# Patient Record
Sex: Female | Born: 1990 | Race: White | Hispanic: No | Marital: Single | Smoking: Never smoker
Health system: Southern US, Community
[De-identification: ages and names within clinical notes are randomized; demographics above are authoritative.]

## PROBLEM LIST (undated history)

## (undated) DIAGNOSIS — T7840XA Allergy, unspecified, initial encounter: Secondary | ICD-10-CM

## (undated) DIAGNOSIS — F988 Other specified behavioral and emotional disorders with onset usually occurring in childhood and adolescence: Secondary | ICD-10-CM

## (undated) DIAGNOSIS — F32A Depression, unspecified: Secondary | ICD-10-CM

## (undated) DIAGNOSIS — F329 Major depressive disorder, single episode, unspecified: Secondary | ICD-10-CM

## (undated) DIAGNOSIS — A6000 Herpesviral infection of urogenital system, unspecified: Secondary | ICD-10-CM

## (undated) DIAGNOSIS — F419 Anxiety disorder, unspecified: Secondary | ICD-10-CM

## (undated) HISTORY — DX: Other specified behavioral and emotional disorders with onset usually occurring in childhood and adolescence: F98.8

## (undated) HISTORY — DX: Herpesviral infection of urogenital system, unspecified: A60.00

## (undated) HISTORY — DX: Anxiety disorder, unspecified: F41.9

## (undated) HISTORY — DX: Depression, unspecified: F32.A

## (undated) HISTORY — DX: Major depressive disorder, single episode, unspecified: F32.9

## (undated) HISTORY — PX: WISDOM TOOTH EXTRACTION: SHX21

## (undated) HISTORY — DX: Allergy, unspecified, initial encounter: T78.40XA

---
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
## 2012-04-16 IMAGING — CR DG CHEST 1V PORT
1 series · 1 of 1 positions shown · non-contrast
Comparison: none

REASON FOR EXAM: Chest Pain
COMMENTS:

PROCEDURE:     DXR - DXR PORTABLE CHEST SINGLE VIEW  - September 01, 2010 [DATE]
RESULT:     The lung fields are clear. The heart, mediastinal and osseous
structures are normal in appearance.

[view not recorded]
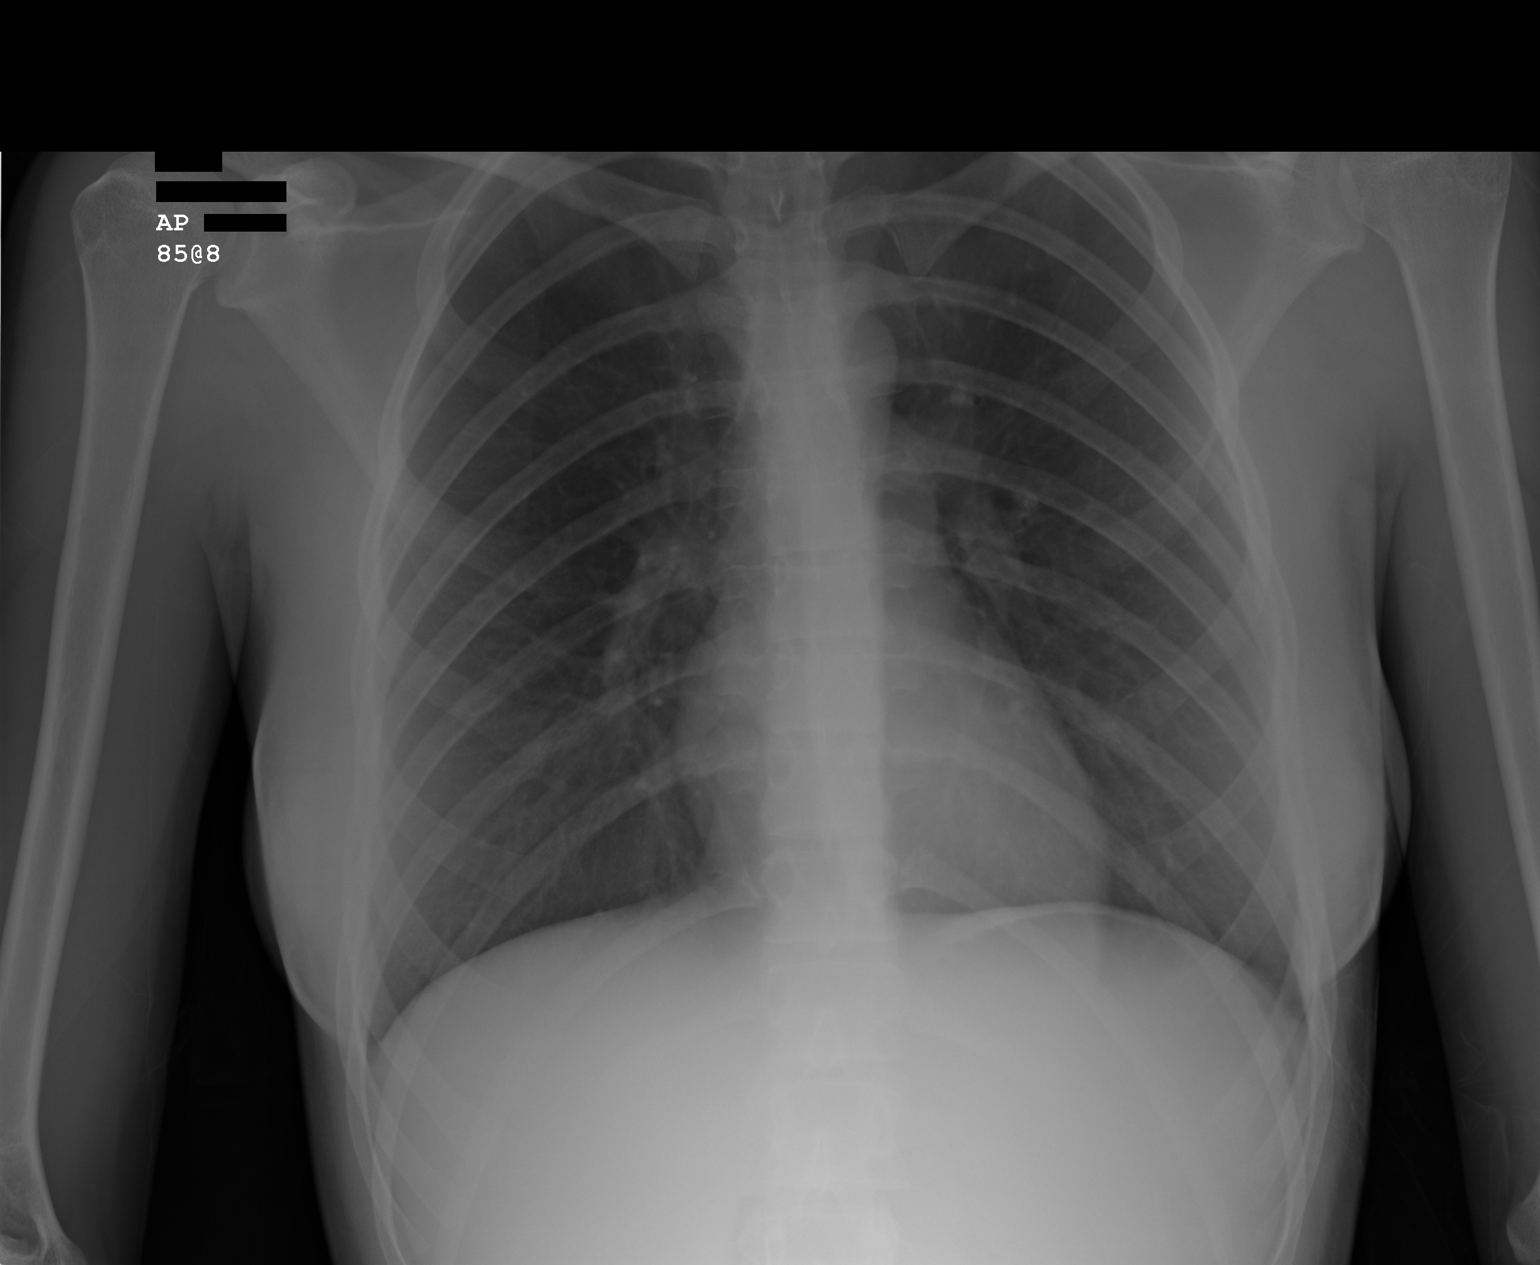

[1 of 1 positions shown; findings below may reference images not displayed]

IMPRESSION: No significant abnormalities are noted.

## 2013-08-05 ENCOUNTER — Emergency Department: Payer: Self-pay | Admitting: Emergency Medicine

## 2013-08-06 LAB — URINALYSIS, COMPLETE
Bilirubin,UR: NEGATIVE
Glucose,UR: NEGATIVE mg/dL (ref 0–75)
Ketone: NEGATIVE
Leukocyte Esterase: NEGATIVE
Nitrite: NEGATIVE
Ph: 6 (ref 4.5–8.0)
Protein: NEGATIVE
RBC,UR: 1 /HPF (ref 0–5)
Specific Gravity: 1.019 (ref 1.003–1.030)
Squamous Epithelial: 1
WBC UR: 1 /HPF (ref 0–5)

## 2013-08-06 LAB — COMPREHENSIVE METABOLIC PANEL
Albumin: 4.6 g/dL (ref 3.4–5.0)
Alkaline Phosphatase: 61 U/L (ref 50–136)
Anion Gap: 5 — ABNORMAL LOW (ref 7–16)
BUN: 13 mg/dL (ref 7–18)
Chloride: 103 mmol/L (ref 98–107)
Co2: 28 mmol/L (ref 21–32)
Creatinine: 0.71 mg/dL (ref 0.60–1.30)
Potassium: 3.9 mmol/L (ref 3.5–5.1)
SGOT(AST): 21 U/L (ref 15–37)
SGPT (ALT): 20 U/L (ref 12–78)
Sodium: 136 mmol/L (ref 136–145)
Total Protein: 8.1 g/dL (ref 6.4–8.2)

## 2013-08-06 LAB — CBC
HCT: 40.1 % (ref 35.0–47.0)
HGB: 13.5 g/dL (ref 12.0–16.0)
MCH: 29.9 pg (ref 26.0–34.0)
MCV: 89 fL (ref 80–100)
Platelet: 307 10*3/uL (ref 150–440)
RBC: 4.52 10*6/uL (ref 3.80–5.20)
WBC: 9.9 10*3/uL (ref 3.6–11.0)

## 2013-08-06 LAB — LIPASE, BLOOD: Lipase: 86 U/L (ref 73–393)

## 2014-07-21 ENCOUNTER — Encounter (HOSPITAL_COMMUNITY): Payer: Self-pay | Admitting: Emergency Medicine

## 2014-07-21 ENCOUNTER — Emergency Department (HOSPITAL_COMMUNITY): Payer: BC Managed Care – PPO

## 2014-07-21 ENCOUNTER — Emergency Department (HOSPITAL_COMMUNITY)
Admission: EM | Admit: 2014-07-21 | Discharge: 2014-07-21 | Disposition: A | Discharge status: Home / Self Care | Payer: BC Managed Care – PPO | Attending: Emergency Medicine | Admitting: Emergency Medicine

## 2014-07-21 DIAGNOSIS — R109 Unspecified abdominal pain: Secondary | ICD-10-CM | POA: Insufficient documentation

## 2014-07-21 DIAGNOSIS — Z3202 Encounter for pregnancy test, result negative: Secondary | ICD-10-CM | POA: Insufficient documentation

## 2014-07-21 DIAGNOSIS — Z880 Allergy status to penicillin: Secondary | ICD-10-CM | POA: Diagnosis not present

## 2014-07-21 DIAGNOSIS — R1110 Vomiting, unspecified: Secondary | ICD-10-CM | POA: Insufficient documentation

## 2014-07-21 DIAGNOSIS — Z791 Long term (current) use of non-steroidal anti-inflammatories (NSAID): Secondary | ICD-10-CM | POA: Insufficient documentation

## 2014-07-21 LAB — URINALYSIS, ROUTINE W REFLEX MICROSCOPIC
Bilirubin Urine: NEGATIVE
GLUCOSE, UA: NEGATIVE mg/dL
KETONES UR: 15 mg/dL — AB
Nitrite: NEGATIVE
PROTEIN: NEGATIVE mg/dL
Specific Gravity, Urine: 1.025 (ref 1.005–1.030)
Urobilinogen, UA: 0.2 mg/dL (ref 0.0–1.0)
pH: 7 (ref 5.0–8.0)

## 2014-07-21 LAB — COMPREHENSIVE METABOLIC PANEL
ALBUMIN: 4.5 g/dL (ref 3.5–5.2)
ALT: 10 U/L (ref 0–35)
AST: 17 U/L (ref 0–37)
Alkaline Phosphatase: 33 U/L — ABNORMAL LOW (ref 39–117)
Anion gap: 15 (ref 5–15)
BILIRUBIN TOTAL: 1.2 mg/dL (ref 0.3–1.2)
BUN: 16 mg/dL (ref 6–23)
CHLORIDE: 103 meq/L (ref 96–112)
CO2: 24 meq/L (ref 19–32)
Calcium: 9.7 mg/dL (ref 8.4–10.5)
Creatinine, Ser: 1.13 mg/dL — ABNORMAL HIGH (ref 0.50–1.10)
GFR calc Af Amer: 80 mL/min — ABNORMAL LOW (ref >90)
GFR, EST NON AFRICAN AMERICAN: 69 mL/min — AB (ref >90)
Glucose, Bld: 97 mg/dL (ref 70–99)
POTASSIUM: 4.2 meq/L (ref 3.7–5.3)
SODIUM: 142 meq/L (ref 137–147)
Total Protein: 7.9 g/dL (ref 6.0–8.3)

## 2014-07-21 LAB — CBC WITH DIFFERENTIAL/PLATELET
BASOS ABS: 0 10*3/uL (ref 0.0–0.1)
Basophils Relative: 0 % (ref 0–1)
EOS ABS: 0.1 10*3/uL (ref 0.0–0.7)
Eosinophils Relative: 1 % (ref 0–5)
HCT: 40.5 % (ref 36.0–46.0)
Hemoglobin: 13.5 g/dL (ref 12.0–15.0)
LYMPHS ABS: 1.8 10*3/uL (ref 0.7–4.0)
LYMPHS PCT: 14 % (ref 12–46)
MCH: 29.9 pg (ref 26.0–34.0)
MCHC: 33.3 g/dL (ref 30.0–36.0)
MCV: 89.8 fL (ref 78.0–100.0)
MONO ABS: 0.7 10*3/uL (ref 0.1–1.0)
Monocytes Relative: 5 % (ref 3–12)
Neutro Abs: 10.3 10*3/uL — ABNORMAL HIGH (ref 1.7–7.7)
Neutrophils Relative %: 80 % — ABNORMAL HIGH (ref 43–77)
Platelets: 328 10*3/uL (ref 150–400)
RBC: 4.51 MIL/uL (ref 3.87–5.11)
RDW: 12.8 % (ref 11.5–15.5)
WBC: 12.9 10*3/uL — AB (ref 4.0–10.5)

## 2014-07-21 LAB — URINE MICROSCOPIC-ADD ON

## 2014-07-21 LAB — PREGNANCY, URINE: Preg Test, Ur: NEGATIVE

## 2014-07-21 MED ORDER — RAMELTEON 8 MG PO TABS: 8.0000 mg | ORAL_TABLET | Freq: Every day | ORAL | Status: DC

## 2014-07-21 MED ORDER — PROMETHAZINE HCL 25 MG PO TABS: 25.0000 mg | ORAL_TABLET | Freq: Four times a day (QID) | ORAL | Status: DC | PRN

## 2014-07-21 MED ORDER — OXYCODONE-ACETAMINOPHEN 5-325 MG PO TABS
ORAL_TABLET | ORAL | Status: DC
Start: 2014-07-21 — End: 2014-07-21

## 2014-07-21 MED ORDER — HYDROCODONE-ACETAMINOPHEN 5-325 MG PO TABS: ORAL_TABLET | ORAL | Status: DC

## 2014-07-21 MED ORDER — OXYCODONE-ACETAMINOPHEN 5-325 MG PO TABS
1.0000 | ORAL_TABLET | Freq: Once | ORAL | Status: AC
  Administered 2014-07-21: 1 via ORAL
  Filled 2014-07-21: qty 1

## 2014-07-21 MED ORDER — ONDANSETRON 4 MG PO TBDP
8.0000 mg | ORAL_TABLET | Freq: Once | ORAL | Status: AC
  Administered 2014-07-21: 8 mg via ORAL
  Filled 2014-07-21: qty 2

## 2014-07-21 NOTE — Discharge Instructions (Signed)
Take vicodin for breakthrough pain, do not drink alcohol, drive, care for children or do other critical tasks while taking vicodin.  Push fluids: take small frequent sips of water or Gatorade, do not drink any soda, juice or caffeinated beverages.    Slowly resume solid diet as desired. Avoid food that are spicy, contain dairy and/or have high fat content.  Aviod NSAIDs (aspirin, motrin, ibuprofen, naproxen, Aleve et Karie Soda) for pain control because they will irritate your stomach.  Please follow with your primary care doctor in the next 2 days for a check-up. They must obtain records for further management.   Do not hesitate to return to the Emergency Department for any new, worsening or concerning symptoms.

## 2014-07-21 NOTE — ED Notes (Signed)
Pt reports she woke up this morning with left sided flank pain it progressively became worse. Denies blood in urine/dysuria. Denies abd pain. Pt tearful. Nad, skin warm and dry, resp e/u.

## 2014-07-21 NOTE — ED Provider Notes (Signed)
CSN: [id number]     Arrival date & time 07/21/14  1425 History   First MD Initiated Contact with Patient 07/21/14 1804     Chief Complaint  Patient presents with  . Flank Pain     (Consider location/radiation/quality/duration/timing/severity/associated sxs/prior Treatment) HPI  Joyce Wallace is a 23 y.o. female complaining of left flank pain onset this morning at about 11:30. Patient states that originally the pain was a 5/10 and it increased to 9/10. Patient states that she took naproxen with little relief. Patient had 2 episodes of nonbloody, nonbilious, non-coffee ground emesis. Patient denies history of kidney stones, fever, chills, dysuria, urinary frequency concentrated or foul-smelling urine, hematuria (patient states that she is about to start her menstrual cycle), abnormal vaginal discharge, trauma, abnormal exertion/excersize, heavy lifting. She states she had a mild lower abdominal cramping this morning which she attributed to premenstrual syndrome. States that this cramping is now resolved. Patient was given Percocet in triage, has been tolerating by mouth and reports resolution of nausea and vomiting.  History reviewed. No pertinent past medical history. History reviewed. No pertinent past surgical history. No family history on file. History  Substance Use Topics  . Smoking status: Never Smoker   . Smokeless tobacco: Not on file  . Alcohol Use: Yes     Comment: occassionally   OB History   Grav Para Term Preterm Abortions TAB SAB Ect Mult Living                 Review of Systems  10 systems reviewed and found to be negative, except as noted in the HPI.    Allergies  Amoxicillin  Home Medications   Prior to Admission medications   Medication Sig Start Date End Date Taking? Authorizing Provider  ibuprofen (ADVIL,MOTRIN) 200 MG tablet Take 800 mg by mouth every 6 (six) hours as needed for mild pain.   Yes Historical Provider, MD  HYDROcodone-acetaminophen  (NORCO/VICODIN) 5-325 MG per tablet Take 1-2 tablets by mouth every 6 hours as needed for pain. 07/21/14   Lander Eslick, PA-C  promethazine (PHENERGAN) 25 MG tablet Take 1 tablet (25 mg total) by mouth every 6 (six) hours as needed for nausea or vomiting. 07/21/14   [name], PA-C  ramelteon (ROZEREM) 8 MG tablet Take 1 tablet (8 mg total) by mouth at bedtime. 07/21/14   [name], PA-C   BP 115/76  Pulse 67  Temp(Src) 98.1 F (36.7 C) (Oral)  Resp 16  Ht  (1.6 m)  Wt 121 lb (54.885 kg)  BMI 21.44 kg/m2  SpO2 100%  LMP 06/24/2014 Physical Exam  Nursing note and vitals reviewed. Constitutional: She is oriented to person, place, and time. She appears well-developed and well-nourished. No distress.  HENT:  Head: Normocephalic and atraumatic.  Mouth/Throat: Oropharynx is clear and moist.  Eyes: Conjunctivae and EOM are normal.  Neck: Normal range of motion. Neck supple.  Cardiovascular: Normal rate, regular rhythm and intact distal pulses.   Pulmonary/Chest: Breath sounds normal. No stridor. No respiratory distress. She has no wheezes. She has no rales. She exhibits no tenderness.  Abdominal: Soft. Bowel sounds are normal. She exhibits no distension and no mass. There is no tenderness. There is no rebound and no guarding.  Genitourinary:  Vision diffusely tender to palpation along the left flank. No discrete CVA tenderness. No signs of trauma, lung sounds clear to auscultation with no crepitance  Musculoskeletal: Normal range of motion.       Back:  Neurological:  She is alert and oriented to person, place, and time.  Psychiatric: She has a normal mood and affect.    ED Course  Procedures (including critical care time) Labs Review Labs Reviewed  COMPREHENSIVE METABOLIC PANEL - Abnormal; Notable for the following:    Creatinine, Ser 1.13 (*)    Alkaline Phosphatase 33 (*)    GFR calc non Af Amer 69 (*)    GFR calc Af Amer 80 (*)    All other components within  normal limits  CBC WITH DIFFERENTIAL - Abnormal; Notable for the following:    WBC 12.9 (*)    Neutrophils Relative % 80 (*)    Neutro Abs 10.3 (*)    All other components within normal limits  URINALYSIS, ROUTINE W REFLEX MICROSCOPIC - Abnormal; Notable for the following:    APPearance CLOUDY (*)    Hgb urine dipstick SMALL (*)    Ketones, ur 15 (*)    Leukocytes, UA TRACE (*)    All other components within normal limits  URINE MICROSCOPIC-ADD ON - Abnormal; Notable for the following:    Squamous Epithelial / LPF FEW (*)    Bacteria, UA FEW (*)    Casts HYALINE CASTS (*)    All other components within normal limits  PREGNANCY, URINE    Imaging Review US Abdomen Complete  07/21/2014   CLINICAL DATA:  Left flank pain since this morning with a small amount of hematuria.  EXAM: ULTRASOUND ABDOMEN COMPLETE  COMPARISON:  None.  FINDINGS: Gallbladder:  3 mm echogenic focus associated with the wall of the gallbladder does not cause posterior acoustic shadowing. This is likely a small gallbladder polyp. A non-shadowing stone is less likely. Gallbladder wall thickness is normal. Sonographic Murphy sign is negative.  Common bile duct:  Diameter: 3.0 mm  Liver:  No focal lesion identified. Within normal limits in parenchymal echogenicity.  IVC:  No abnormality visualized.  Pancreas:  Visualized portion unremarkable.  Spleen:  Size and appearance within normal limits.  Right Kidney:  Length: 11.4 cm. Echogenicity within normal limits. No mass or hydronephrosis visualized.  Left Kidney:  Length: 12.2 cm. Echogenicity within normal limits. No mass or hydronephrosis visualized.  Abdominal aorta:  No aneurysm visualized.  Other findings:  No ascites identified.  IMPRESSION: 1. No explanation for left flank pain identified. Specifically, both kidneys have normal appearances. 2. Incidental probable 3 mm gallbladder polyp.   Electronically Signed   By: Britta Mccreedy M.D.   On: 07/21/2014 19:34     EKG  Interpretation None      MDM   Final diagnoses:  Left flank pain    Filed Vitals:   07/21/14 1430 07/21/14 1438 07/21/14 1659 07/21/14 2003  BP: 118/61  124/72 115/76  Pulse: 77  73 67  Temp: 98.6 F (37 C)  98.1 F (36.7 C)   TempSrc: Oral  Oral   Resp: 20   16  Height:   (1.6 m)    Weight:  121 lb (54.885 kg)    SpO2: 99%  99% 100%    Medications  ondansetron (ZOFRAN-ODT) disintegrating tablet 8 mg (8 mg Oral Given 07/21/14 1455)  oxyCODONE-acetaminophen (PERCOCET/ROXICET) 5-325 MG per tablet 1 tablet (1 tablet Oral Given 07/21/14 1455)    Joyce Wallace is a 23 y.o. female presenting with 2 episodes of emesis with left flank pain. Patient has no urinary signs. Patient is diffusely tender to palpation in the left flank, there is no focal CVA tenderness. Urinalysis  is not consistent with infection. Abdominal exam is benign. Patient is comfortable with stable vital signs normal blood work. I have encouraged her to push fluids, advised her that this is likely secondary to musculoskeletal pain. It is possible that this may have been a small stone which she passed. Ultrasound with no signs of hydronephrosis. They do note an incidental gallbladder polyp. Patient reports she has primary care physician that she can follow with. We have discussed return precautions. On discharge patient has asked for sleep medication. Will write her for a short prescription and asked her to follow with her primary care physician with respect to insomnia.  Evaluation does not show pathology that would require ongoing emergent intervention or inpatient treatment. Pt is hemodynamically stable and mentating appropriately. Discussed findings and plan with patient/guardian, who agrees with care plan. All questions answered. Return precautions discussed and outpatient follow up given.   New Prescriptions   HYDROCODONE-ACETAMINOPHEN (NORCO/VICODIN) 5-325 MG PER TABLET    Take 1-2 tablets by mouth every  6 hours as needed for pain.   PROMETHAZINE (PHENERGAN) 25 MG TABLET    Take 1 tablet (25 mg total) by mouth every 6 (six) hours as needed for nausea or vomiting.   RAMELTEON (ROZEREM) 8 MG TABLET    Take 1 tablet (8 mg total) by mouth at bedtime.         Wynetta Emery, PA-C 07/21/14 2017     Wynetta Emery, PA-C 07/22/14 [phone]

## 2014-07-21 NOTE — ED Notes (Signed)
Apologized to pt for wait time. Pt reports decrease in nausea and pain. VSS. NAD.

## 2014-07-23 NOTE — ED Provider Notes (Signed)
History/physical exam/procedure(s) were performed by non-physician practitioner and as supervising physician I was immediately available for consultation/collaboration. I have reviewed all notes and am in agreement with care and plan.   Hilario Quarry, MD 07/23/14 [phone]

## 2015-03-22 IMAGING — US ABDOMEN ULTRASOUND LIMITED
1 series · 14 of 25 positions shown · non-contrast
Comparison: none

REASON FOR EXAM: epigastric pain eval GB
COMMENTS:   Body Site: GB and Fossa, CBD, Head of Pancreas

[Series 1: abdomen ultrasound limited · 0.20mm/px · 14 of 43 slices shown]
[im 1/43]
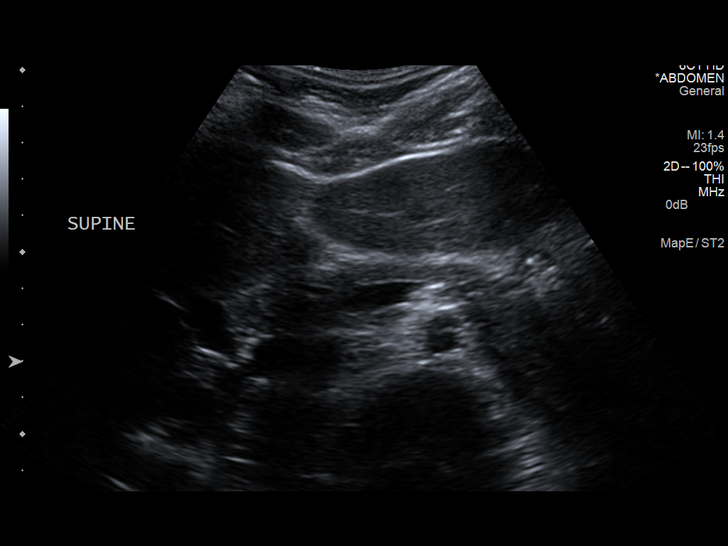
[im 4/43]
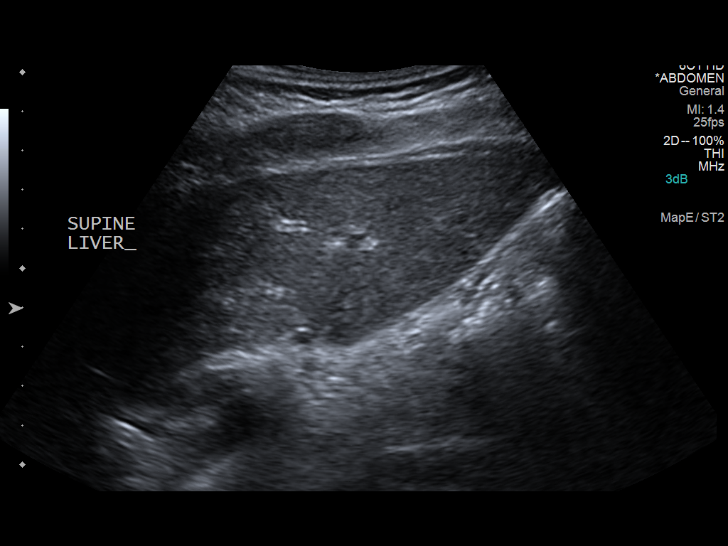
[im 8/43]
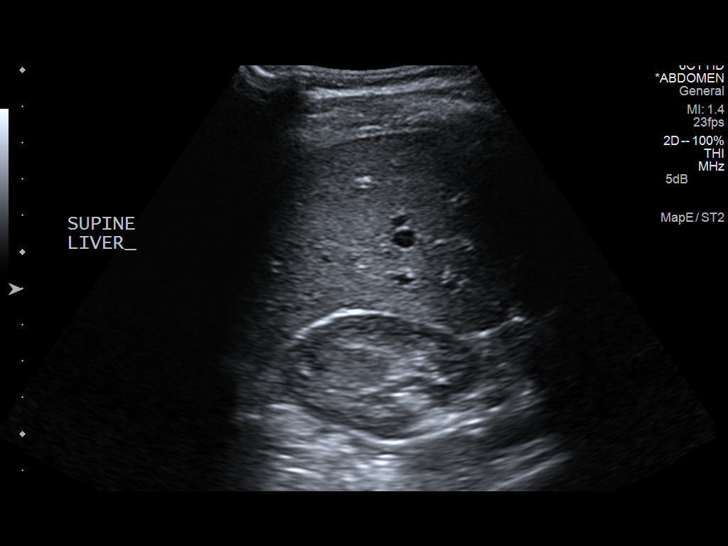
[im 11/43]
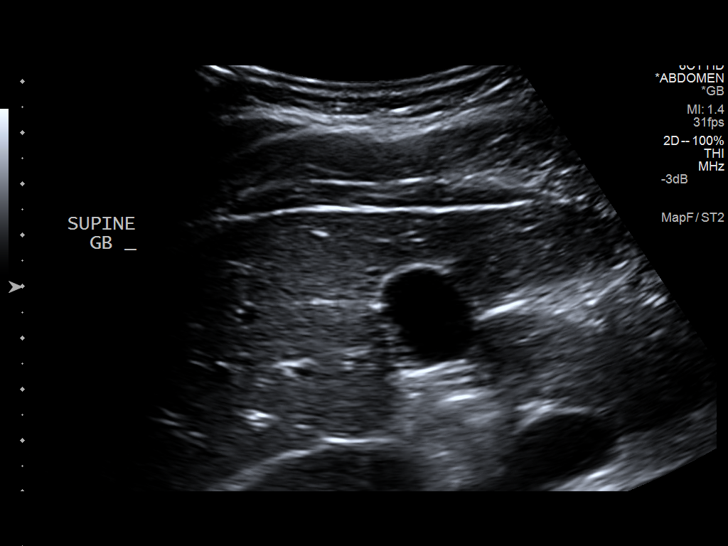
[im 15/43]
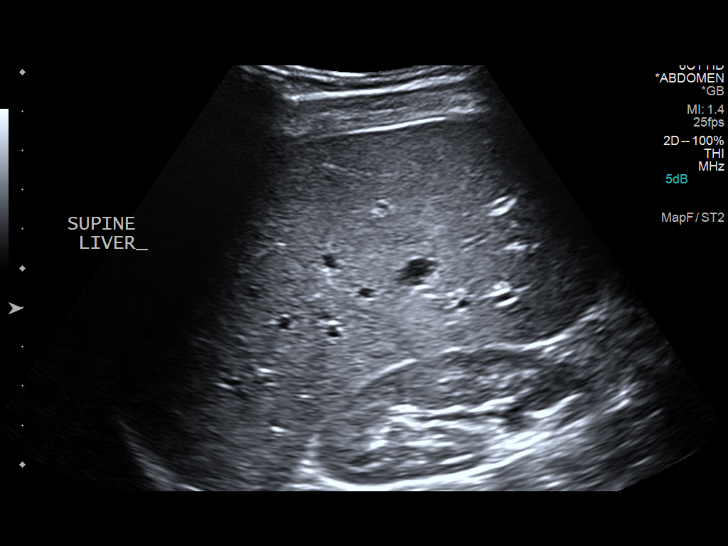
[im 16/43]
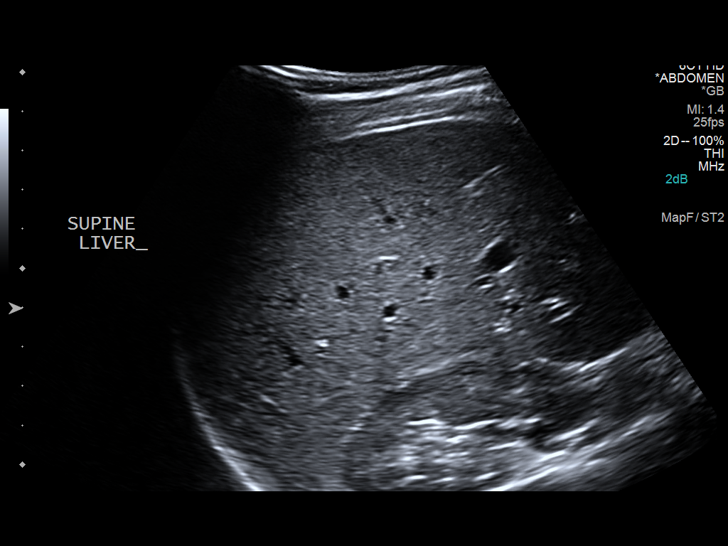
[im 20/43]
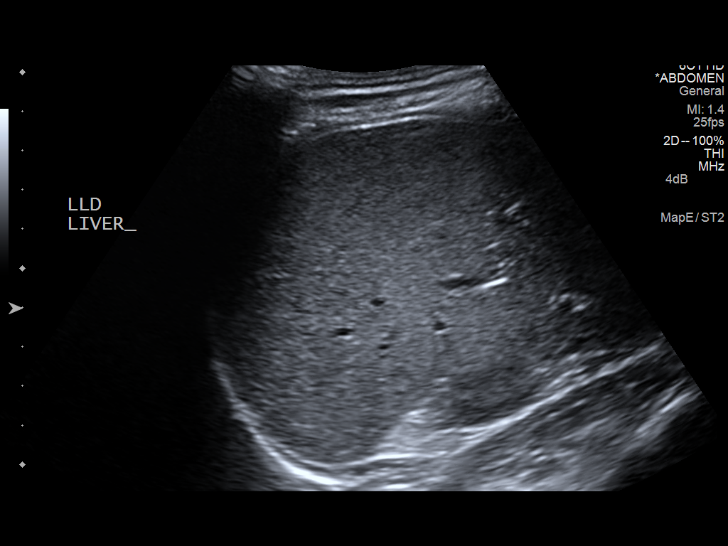
[im 23/43]
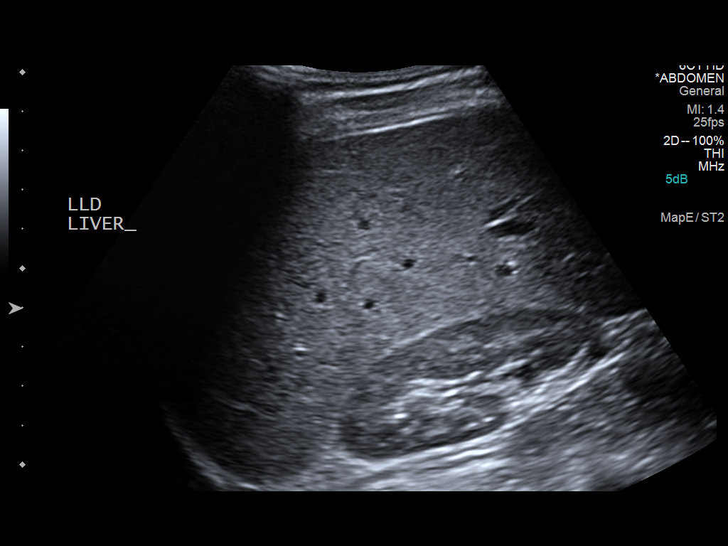
[im 27/43]
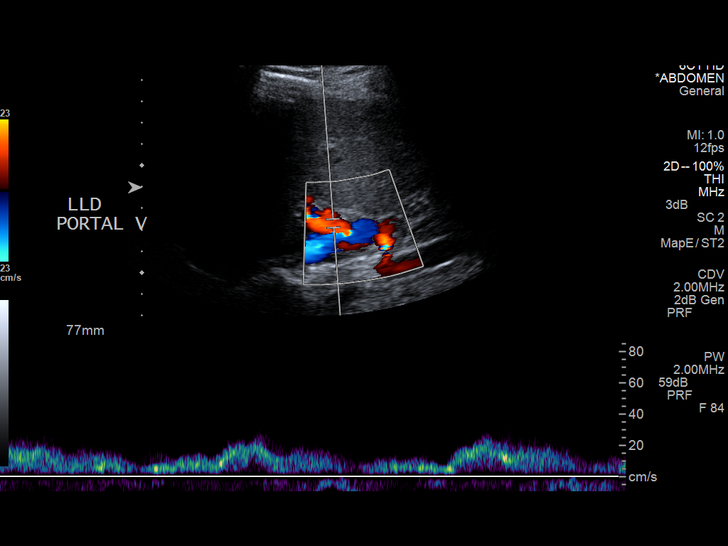
[im 29/43]
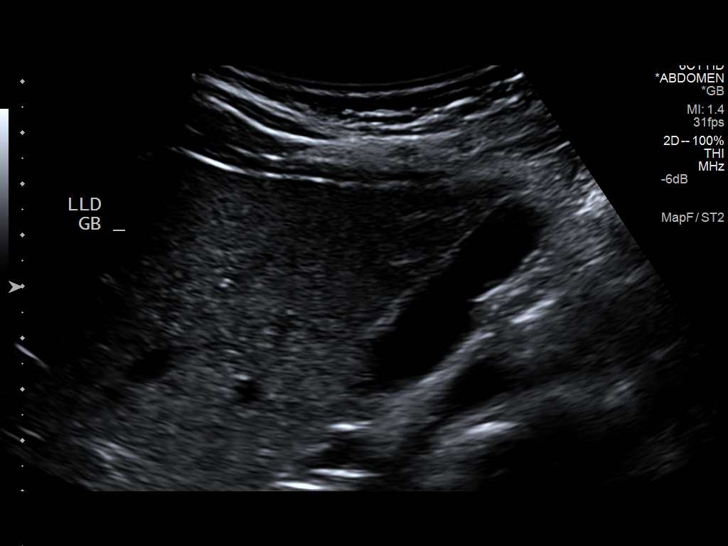
[im 32/43]
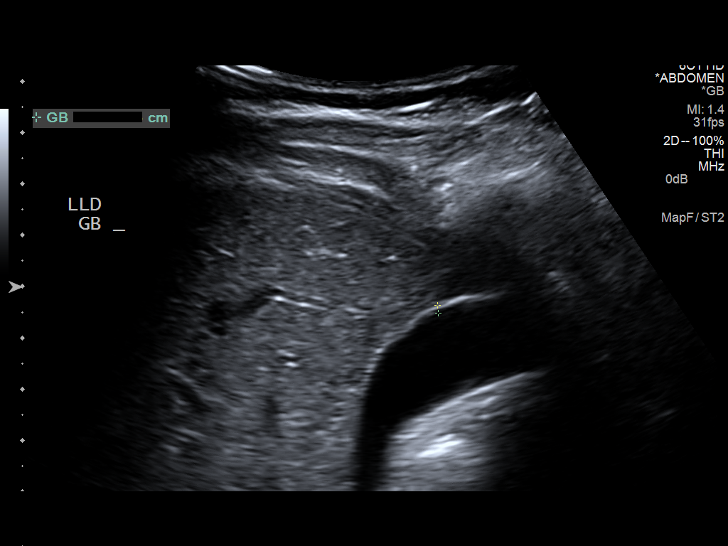
[im 36/43]
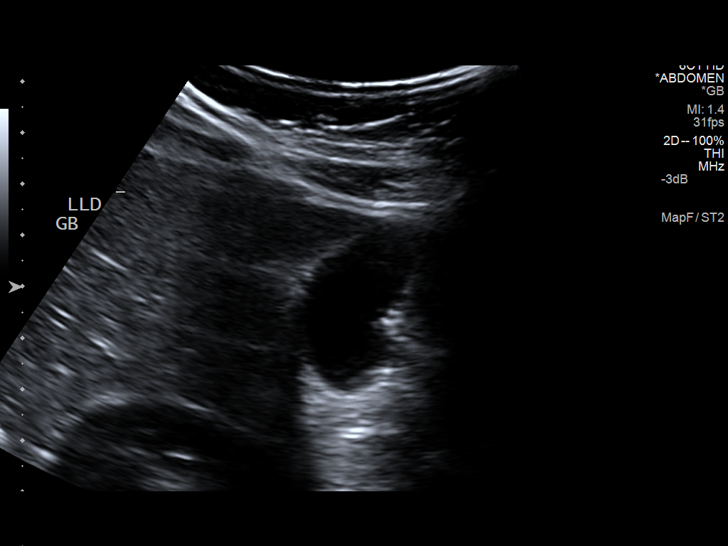
[im 39/43]
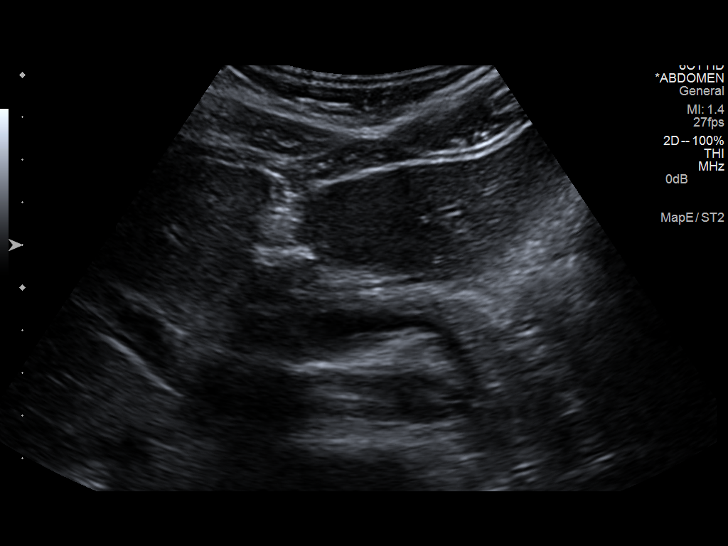
[im 43/43]
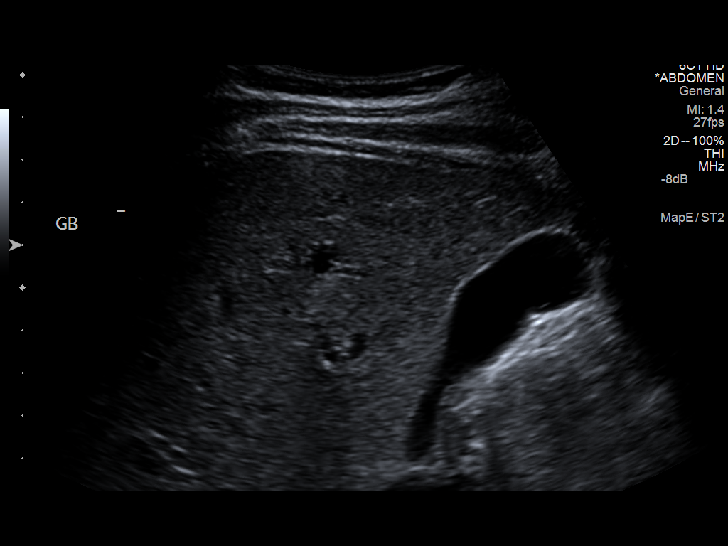

[14 of 25 positions shown; findings below may reference images not displayed]

PROCEDURE:     US  - US ABDOMEN LIMITED SURVEY  - August 06, 2013  [DATE]

RESULT:     Limited right upper quadrant abdominal ultrasound is performed.
The visualized pancreas appears within normal limits. Portions of the head
and extreme tail are not well seen. The hepatic echotexture is normal area
the liver length is 12.88 cm. There is no intrahepatic or extrahepatic
biliary ductal dilation. The right kidney length is 10.16 cm. The portal
venous flow is normal. There is no pericholecystic fluid. There is a
negative sonographic Murphy's sign. No gallstones are evident. The common
bile duct and gallbladder wall thickness are normal with the common bile
duct diameter measuring 2.2 mm and the gallbladder wall thickness measuring
1.5 mm.
IMPRESSION: 1. Normal limited right upper quadrant abdominal ultrasound.

[REDACTED]

## 2015-03-22 IMAGING — CR DG CHEST 2V
1 series · 2 of 2 positions shown · non-contrast
Comparison: none

REASON FOR EXAM: epigastric pain
COMMENTS:

PROCEDURE:     DXR - DXR CHEST PA (OR AP) AND LATERAL  - August 06, 2013  [DATE]
RESULT:     The lungs are clear. The cardiac silhouette and visualized bony
skeleton are unremarkable.

[Series 1: w chest pa · 0.14mm/px · 2 of 2 slices shown]
[im 1/2]
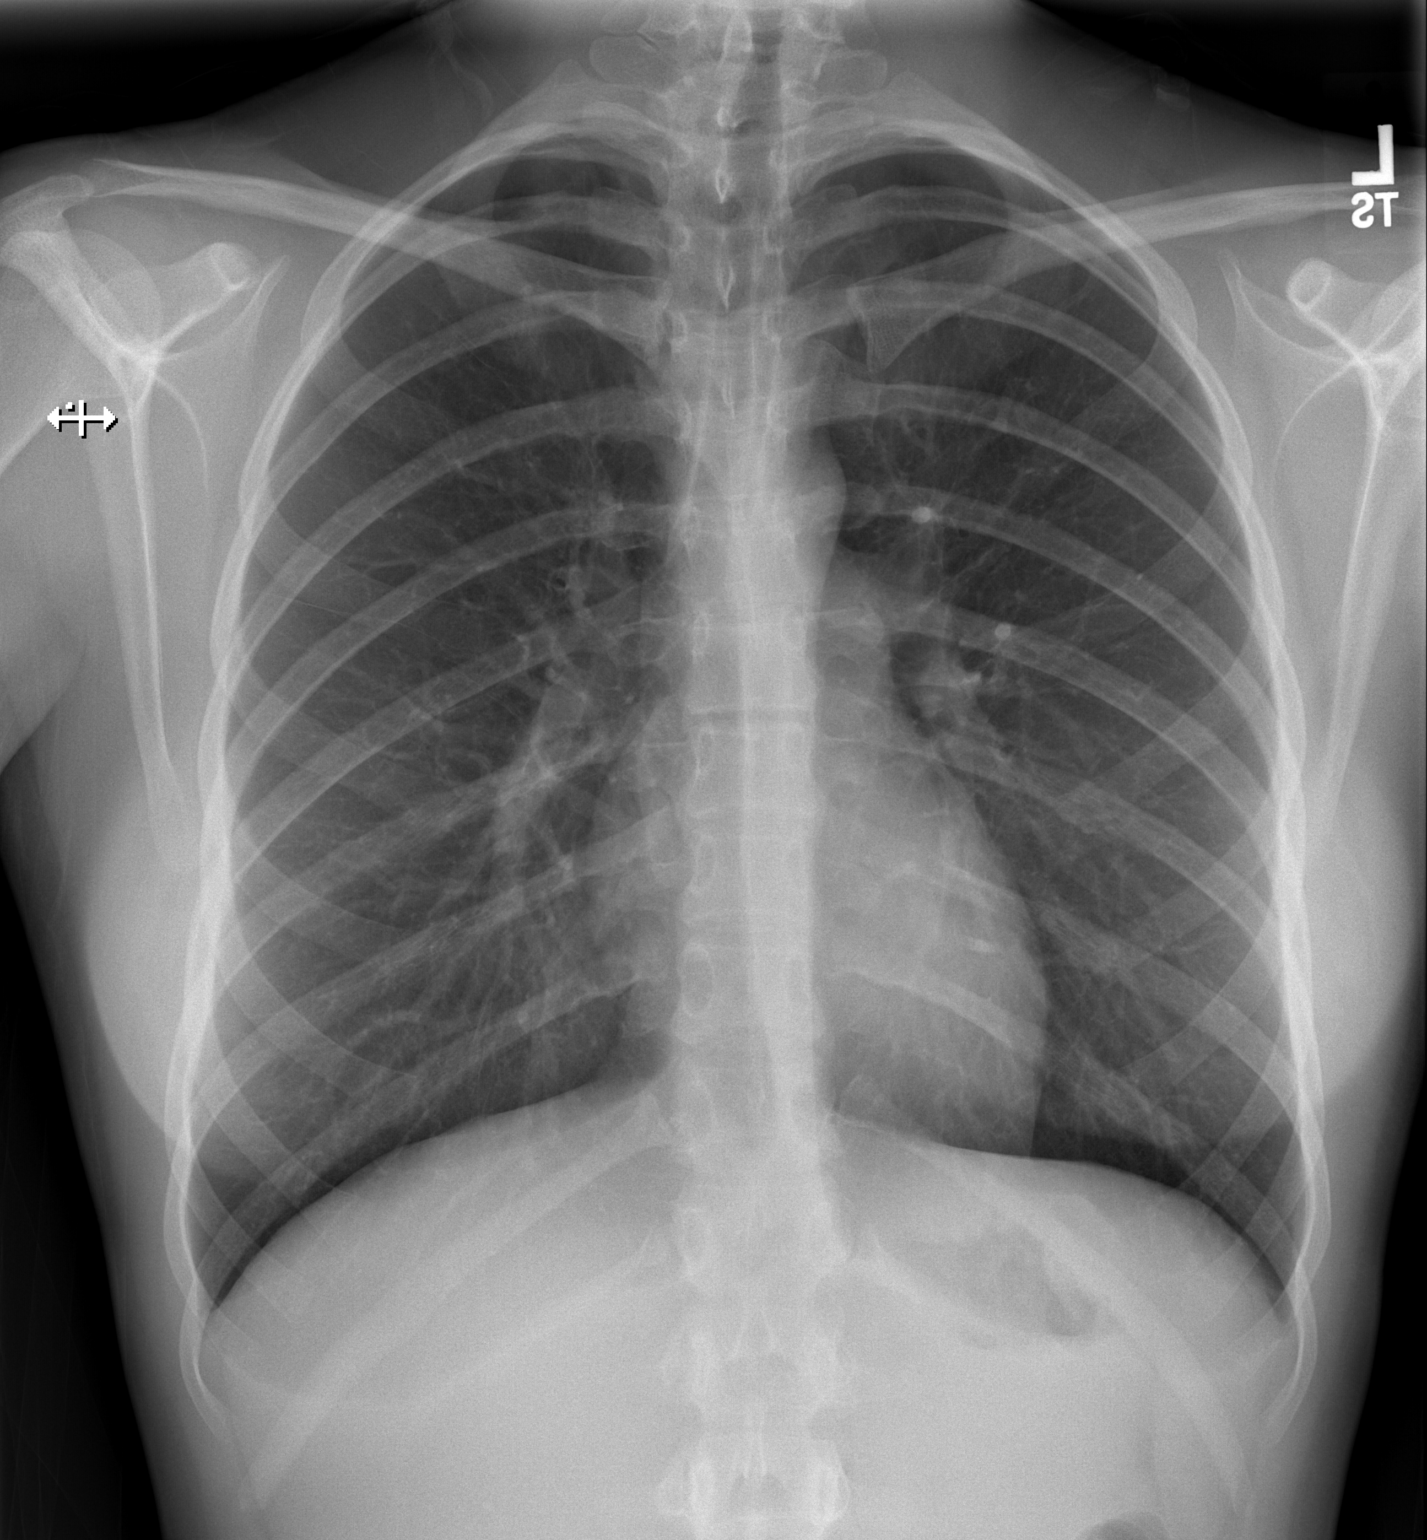
[im 2/2]
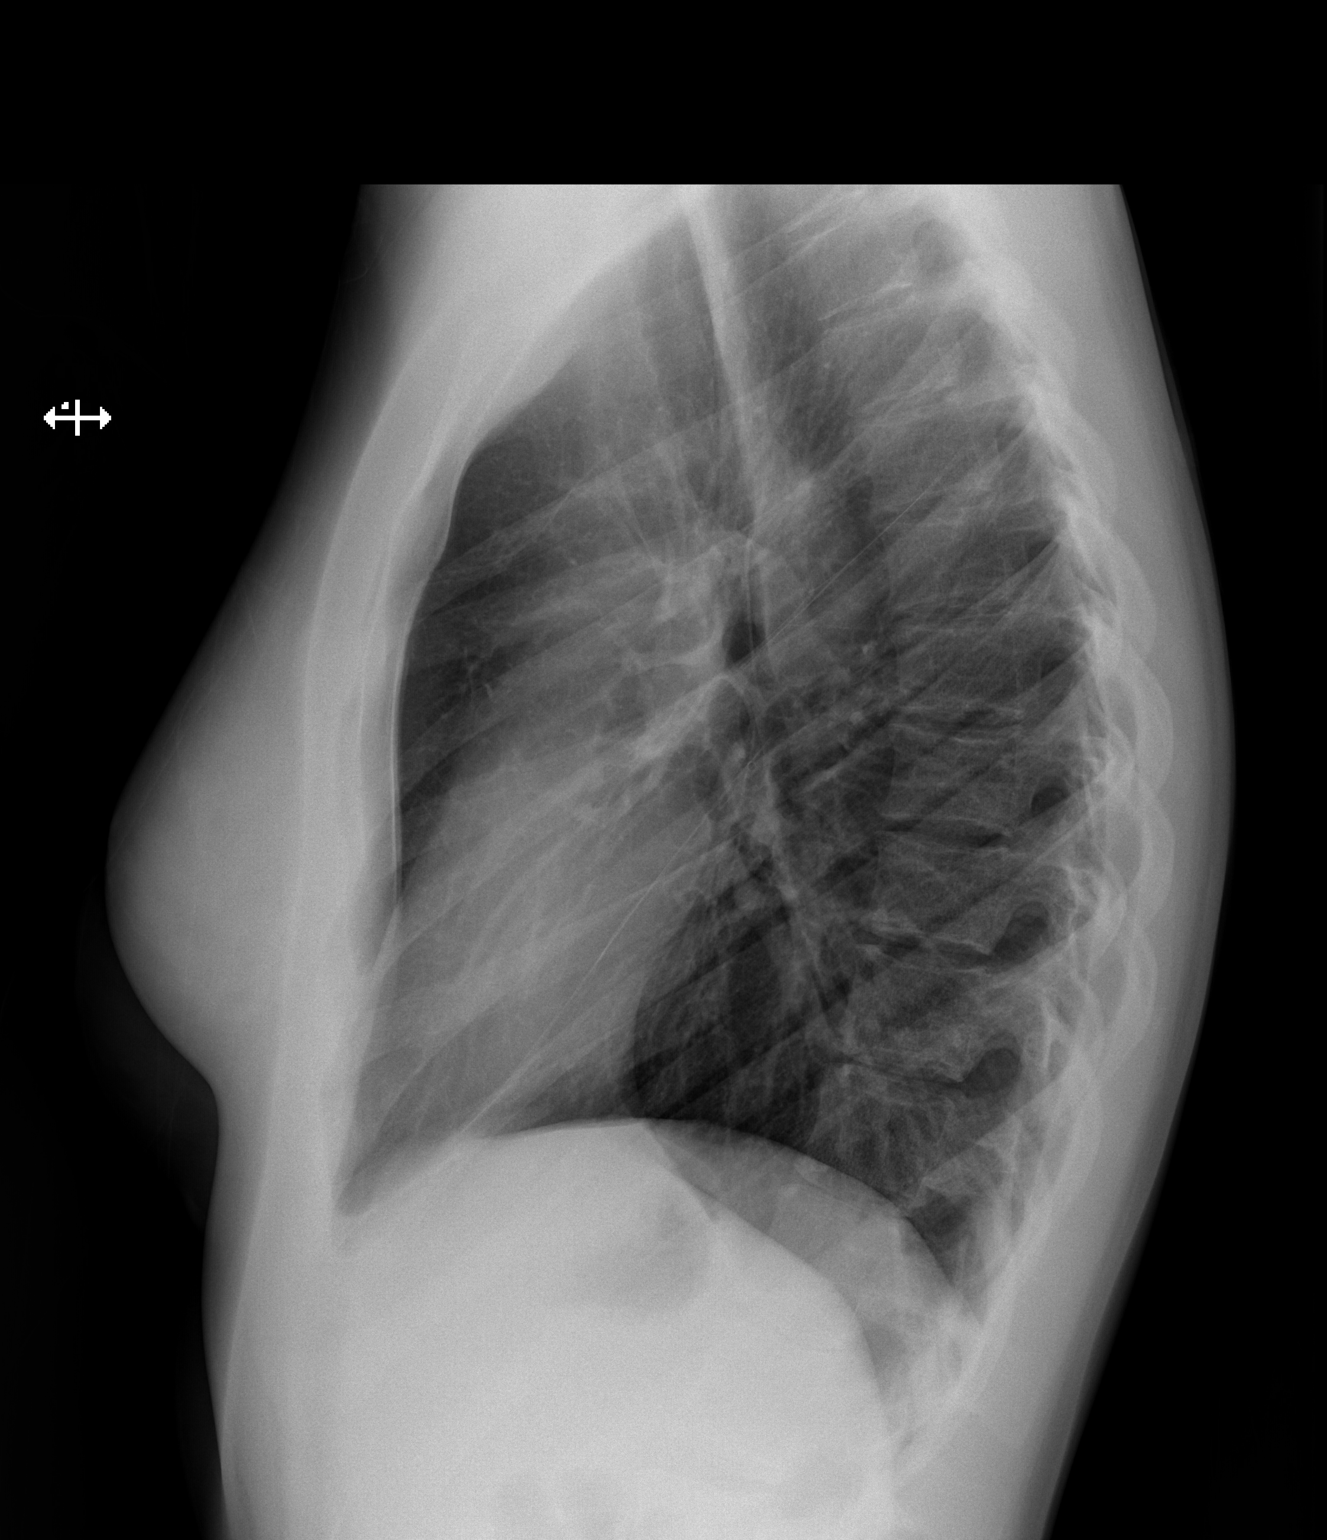

[2 of 2 positions shown; findings below may reference images not displayed]

IMPRESSION: 1. Chest radiograph without evidence of acute cardiopulmonary disease.
2. Comparison made to prior dated 09/01/2010.

## 2016-05-16 LAB — HM PAP SMEAR

## 2017-08-21 ENCOUNTER — Ambulatory Visit: Payer: Self-pay | Admitting: Maternal Newborn

## 2018-01-17 ENCOUNTER — Encounter: Payer: Self-pay | Admitting: Advanced Practice Midwife

## 2018-01-17 ENCOUNTER — Ambulatory Visit (INDEPENDENT_AMBULATORY_CARE_PROVIDER_SITE_OTHER): Payer: Self-pay | Admitting: Advanced Practice Midwife

## 2018-01-17 VITALS — BP 114/70 | Ht 64.0 in | Wt 122.0 lb

## 2018-01-17 DIAGNOSIS — Z8619 Personal history of other infectious and parasitic diseases: Secondary | ICD-10-CM

## 2018-01-17 DIAGNOSIS — Z124 Encounter for screening for malignant neoplasm of cervix: Secondary | ICD-10-CM

## 2018-01-17 DIAGNOSIS — Z01419 Encounter for gynecological examination (general) (routine) without abnormal findings: Secondary | ICD-10-CM

## 2018-01-17 LAB — HM PAP SMEAR: HM Pap smear: NORMAL

## 2018-01-17 MED ORDER — VALACYCLOVIR HCL 1 G PO TABS: 2000.0000 mg | ORAL_TABLET | Freq: Two times a day (BID) | ORAL | 2 refills | Status: AC

## 2018-01-17 NOTE — Progress Notes (Signed)
Patient ID: Joyce Wallace, female   DOB: 1991-02-03, 27 y.o.   MRN: [id number]     Gynecology Annual Exam  PCP: System, Pcp Not In  Chief Complaint:  Chief Complaint  Patient presents with  . Annual Exam    History of Present Illness: Patient is a 27 y.o. G0P0000 presents for annual exam. The patient has no complaints today. She is requesting a refill of valtrex for her occasional cold sores.  LMP: Patient's last menstrual period was 12/24/2017. Menarche:not applicable Average Interval: regular, 28 days Duration of flow: 5 days Heavy Menses: no Clots: no Intermenstrual Bleeding: no Postcoital Bleeding: no Dysmenorrhea: no  The patient is sexually active. She currently uses OCP (estrogen/progesterone) for contraception. She denies dyspareunia.  The patient does occasionally perform self breast exams.  There is no notable family history of breast or ovarian cancer in her family. Her paternal grandmother was diagnosed with breast cancer but she does not know at what age.   The patient wears seatbelts: yes.  The patient has regular exercise: yes.  She admits healthy lifestyle diet, adequate h2o and sleep.  The patient denies current symptoms of depression.    Review of Systems: Review of Systems  Constitutional: Negative.   HENT: Negative.   Eyes: Negative.   Respiratory: Negative.   Cardiovascular: Negative.   Gastrointestinal: Negative.   Genitourinary: Negative.   Musculoskeletal: Negative.   Skin: Negative.   Neurological: Negative.   Endo/Heme/Allergies: Negative.   Psychiatric/Behavioral: Negative.     Past Medical History:  Past Medical History:  Diagnosis Date  . ADD (attention deficit disorder)   . Herpes genitalis     Past Surgical History:  Past Surgical History:  Procedure Laterality Date  . WISDOM TOOTH EXTRACTION      Gynecologic History:  Patient's last menstrual period was 12/24/2017. Contraception: OCP (estrogen/progesterone) Last  Pap: 1.5 years ago Results were:  ASCUS with NEGATIVE high risk HPV   Obstetric History: G0P0000  Family History:  Family History  Problem Relation Age of Onset  . Hyperlipidemia Maternal Grandfather   . Hypertension Maternal Grandfather     Social History:  Social History   Socioeconomic History  . Marital status: Single    Spouse name: Not on file  . Number of children: Not on file  . Years of education: Not on file  . Highest education level: Not on file  Social Needs  . Financial resource strain: Not on file  . Food insecurity - worry: Not on file  . Food insecurity - inability: Not on file  . Transportation needs - medical: Not on file  . Transportation needs - non-medical: Not on file  Occupational History  . Not on file  Tobacco Use  . Smoking status: Never Smoker  Substance and Sexual Activity  . Alcohol use: Yes    Comment: occassionally  . Drug use: No  . Sexual activity: Yes    Birth control/protection: Pill  Other Topics Concern  . Not on file  Social History Narrative  . Not on file    Allergies:  Allergies  Allergen Reactions  . Amoxicillin     rash    Medications: Prior to Admission medications   Medication Sig Start Date End Date Taking? Authorizing Provider  albuterol (VENTOLIN HFA) 108 (90 Base) MCG/ACT inhaler 1-2 inhalations every 4-6 hours as needed for wheezing. Dispense spacer as needed. 06/10/17 06/10/18 Yes [provider]  EVEKEO 10 MG TABS Take 1 tablet by mouth 2 (two) times  daily. 11/13/17  Yes [provider]  Norgestimate-Ethinyl Estradiol Triphasic 0.18/0.215/0.25 MG-25 MCG tab Take by mouth.   Yes [provider]  valACYclovir (VALTREX) 1000 MG tablet Take 2 tablets (2,000 mg total) by mouth every 12 (twelve) hours for 45 doses. As needed for cold sore symptoms 01/17/18 02/09/18  Tresea MallGledhill, Taray Normoyle, CNM    Physical Exam Vitals: Blood pressure 114/70, height 5\' 4"  (1.626 m), weight 122 lb (55.3 kg), last  menstrual period 12/24/2017.  General: NAD HEENT: normocephalic, anicteric Thyroid: no enlargement, no palpable nodules Pulmonary: No increased work of breathing, CTAB Cardiovascular: RRR, distal pulses 2+ Breast: Breast symmetrical, no tenderness, no palpable nodules or masses, no skin or nipple retraction present, no nipple discharge.  No axillary or supraclavicular lymphadenopathy. Abdomen: NABS, soft, non-tender, non-distended.  Umbilicus without lesions.  No hepatomegaly, splenomegaly or masses palpable. No evidence of hernia  Genitourinary:  External: Normal external female genitalia.  Normal urethral meatus, normal Bartholin's and Skene's glands.    Vagina: Normal vaginal mucosa, no evidence of prolapse.    Cervix: Grossly normal in appearance, friable, no CMT  Uterus: Non-enlarged, mobile, normal contour.    Adnexa: ovaries non-enlarged, no adnexal masses  Rectal: deferred  Lymphatic: no evidence of inguinal lymphadenopathy Extremities: no edema, erythema, or tenderness Neurologic: Grossly intact Psychiatric: mood appropriate, affect full   Assessment: 27 y.o. G0P0000 routine annual exam  Plan: Problem List Items Addressed This Visit    None    Visit Diagnoses    Well woman exam with routine gynecological exam    -  Primary   Relevant Orders   IGP,rfx Aptima HPV all pth   Cervical cancer screening       Relevant Orders   IGP,rfx Aptima HPV all pth   Hx of cold sores       Relevant Medications   valACYclovir (VALTREX) 1000 MG tablet      1) 4) Gardasil Series discussed and if applicable offered to patient - Patient has not previously completed 3 shot series   2) STI screening  was offered and declined  3)  ASCCP guidelines and rational discussed.  Patient opts for yearly screening interval  4) Contraception - the patient is currently using  OCP (estrogen/progesterone).  She is happy with her current form of contraception and plans to continue We discussed safe  sex practices to reduce her furture risk of STI's.    5) Return in 1 year (on 01/18/2019) for annual established gyn.   Tresea MallJane Radin Raptis, CNM [facility], [facility] 01/17/2018, 3:51 PM

## 2018-01-17 NOTE — Patient Instructions (Addendum)
Health Maintenance, Female Adopting a healthy lifestyle and getting preventive care can go a long way to promote health and wellness. Talk with your health care provider about what schedule of regular examinations is right for you. This is a good chance for you to check in with your provider about disease prevention and staying healthy. In between checkups, there are plenty of things you can do on your own. Experts have done a lot of research about which lifestyle changes and preventive measures are most likely to keep you healthy. Ask your health care provider for more information. Weight and diet Eat a healthy diet  Be sure to include plenty of vegetables, fruits, low-fat dairy products, and lean protein.  Do not eat a lot of foods high in solid fats, added sugars, or salt.  Get regular exercise. This is one of the most important things you can do for your health. ? Most adults should exercise for at least 150 minutes each week. The exercise should increase your heart rate and make you sweat (moderate-intensity exercise). ? Most adults should also do strengthening exercises at least twice a week. This is in addition to the moderate-intensity exercise.  Maintain a healthy weight  Body mass index (BMI) is a measurement that can be used to identify possible weight problems. It estimates body fat based on height and weight. Your health care provider can help determine your BMI and help you achieve or maintain a healthy weight.  For females 69 years of age and older: ? A BMI below 18.5 is considered underweight. ? A BMI of 18.5 to 24.9 is normal. ? A BMI of 25 to 29.9 is considered overweight. ? A BMI of 30 and above is considered obese.  Watch levels of cholesterol and blood lipids  You should start having your blood tested for lipids and cholesterol at 26 years of age, then have this test every 5 years.  You may need to have your cholesterol levels checked more often if: ? Your lipid or  cholesterol levels are high. ? You are older than 26 years of age. ? You are at high risk for heart disease.  Cancer screening Lung Cancer  Lung cancer screening is recommended for adults 70-27 years old who are at high risk for lung cancer because of a history of smoking.  A yearly low-dose CT scan of the lungs is recommended for people who: ? Currently smoke. ? Have quit within the past 15 years. ? Have at least a 30-pack-year history of smoking. A pack year is smoking an average of one pack of cigarettes a day for 1 year.  Yearly screening should continue until it has been 15 years since you quit.  Yearly screening should stop if you develop a health problem that would prevent you from having lung cancer treatment.  Breast Cancer  Practice breast self-awareness. This means understanding how your breasts normally appear and feel.  It also means doing regular breast self-exams. Let your health care provider know about any changes, no matter how small.  If you are in your 20s or 30s, you should have a clinical breast exam (CBE) by a health care provider every 1-3 years as part of a regular health exam.  If you are 68 or older, have a CBE every year. Also consider having a breast X-ray (mammogram) every year.  If you have a family history of breast cancer, talk to your health care provider about genetic screening.  If you are at high risk  for breast cancer, talk to your health care provider about having an MRI and a mammogram every year.  Breast cancer gene (BRCA) assessment is recommended for women who have family members with BRCA-related cancers. BRCA-related cancers include: ? Breast. ? Ovarian. ? Tubal. ? Peritoneal cancers.  Results of the assessment will determine the need for genetic counseling and BRCA1 and BRCA2 testing.  Cervical Cancer Your health care provider may recommend that you be screened regularly for cancer of the pelvic organs (ovaries, uterus, and  vagina). This screening involves a pelvic examination, including checking for microscopic changes to the surface of your cervix (Pap test). You may be encouraged to have this screening done every 3 years, beginning at age 22.  For women ages 56-65, health care providers may recommend pelvic exams and Pap testing every 3 years, or they may recommend the Pap and pelvic exam, combined with testing for human papilloma virus (HPV), every 5 years. Some types of HPV increase your risk of cervical cancer. Testing for HPV may also be done on women of any age with unclear Pap test results.  Other health care providers may not recommend any screening for nonpregnant women who are considered low risk for pelvic cancer and who do not have symptoms. Ask your health care provider if a screening pelvic exam is right for you.  If you have had past treatment for cervical cancer or a condition that could lead to cancer, you need Pap tests and screening for cancer for at least 20 years after your treatment. If Pap tests have been discontinued, your risk factors (such as having a new sexual partner) need to be reassessed to determine if screening should resume. Some women have medical problems that increase the chance of getting cervical cancer. In these cases, your health care provider may recommend more frequent screening and Pap tests.  Colorectal Cancer  This type of cancer can be detected and often prevented.  Routine colorectal cancer screening usually begins at 26 years of age and continues through 26 years of age.  Your health care provider may recommend screening at an earlier age if you have risk factors for colon cancer.  Your health care provider may also recommend using home test kits to check for hidden blood in the stool.  A small camera at the end of a tube can be used to examine your colon directly (sigmoidoscopy or colonoscopy). This is done to check for the earliest forms of colorectal  cancer.  Routine screening usually begins at age 33.  Direct examination of the colon should be repeated every 5-10 years through 26 years of age. However, you may need to be screened more often if early forms of precancerous polyps or small growths are found.  Skin Cancer  Check your skin from head to toe regularly.  Tell your health care provider about any new moles or changes in moles, especially if there is a change in a mole's shape or color.  Also tell your health care provider if you have a mole that is larger than the size of a pencil eraser.  Always use sunscreen. Apply sunscreen liberally and repeatedly throughout the day.  Protect yourself by wearing long sleeves, pants, a wide-brimmed hat, and sunglasses whenever you are outside.  Heart disease, diabetes, and high blood pressure  High blood pressure causes heart disease and increases the risk of stroke. High blood pressure is more likely to develop in: ? People who have blood pressure in the high end of  the normal range (130-139/85-89 mm Hg). ? People who are overweight or obese. ? People who are African American.  If you are 21-29 years of age, have your blood pressure checked every 3-5 years. If you are 3 years of age or older, have your blood pressure checked every year. You should have your blood pressure measured twice-once when you are at a hospital or clinic, and once when you are not at a hospital or clinic. Record the average of the two measurements. To check your blood pressure when you are not at a hospital or clinic, you can use: ? An automated blood pressure machine at a pharmacy. ? A home blood pressure monitor.  If you are between 17 years and 37 years old, ask your health care provider if you should take aspirin to prevent strokes.  Have regular diabetes screenings. This involves taking a blood sample to check your fasting blood sugar level. ? If you are at a normal weight and have a low risk for diabetes,  have this test once every three years after 26 years of age. ? If you are overweight and have a high risk for diabetes, consider being tested at a younger age or more often. Preventing infection Hepatitis B  If you have a higher risk for hepatitis B, you should be screened for this virus. You are considered at high risk for hepatitis B if: ? You were born in a country where hepatitis B is common. Ask your health care provider which countries are considered high risk. ? Your parents were born in a high-risk country, and you have not been immunized against hepatitis B (hepatitis B vaccine). ? You have HIV or AIDS. ? You use needles to inject street drugs. ? You live with someone who has hepatitis B. ? You have had sex with someone who has hepatitis B. ? You get hemodialysis treatment. ? You take certain medicines for conditions, including cancer, organ transplantation, and autoimmune conditions.  Hepatitis C  Blood testing is recommended for: ? Everyone born from [birth year] through 1964. ? Anyone with known risk factors for hepatitis C.  Sexually transmitted infections (STIs)  You should be screened for sexually transmitted infections (STIs) including gonorrhea and chlamydia if: ? You are sexually active and are younger than 26 years of age. ? You are older than 26 years of age and your health care provider tells you that you are at risk for this type of infection. ? Your sexual activity has changed since you were last screened and you are at an increased risk for chlamydia or gonorrhea. Ask your health care provider if you are at risk.  If you do not have HIV, but are at risk, it may be recommended that you take a prescription medicine daily to prevent HIV infection. This is called pre-exposure prophylaxis (PrEP). You are considered at risk if: ? You are sexually active and do not regularly use condoms or know the HIV status of your partner(s). ? You take drugs by injection. ? You are  sexually active with a partner who has HIV.  Talk with your health care provider about whether you are at high risk of being infected with HIV. If you choose to begin PrEP, you should first be tested for HIV. You should then be tested every 3 months for as long as you are taking PrEP. Pregnancy  If you are premenopausal and you may become pregnant, ask your health care provider about preconception counseling.  If you may become  pregnant, take 400 to 800 micrograms (mcg) of folic acid every day.  If you want to prevent pregnancy, talk to your health care provider about birth control (contraception). Osteoporosis and menopause  Osteoporosis is a disease in which the bones lose minerals and strength with aging. This can result in serious bone fractures. Your risk for osteoporosis can be identified using a bone density scan.  If you are 52 years of age or older, or if you are at risk for osteoporosis and fractures, ask your health care provider if you should be screened.  Ask your health care provider whether you should take a calcium or vitamin D supplement to lower your risk for osteoporosis.  Menopause may have certain physical symptoms and risks.  Hormone replacement therapy may reduce some of these symptoms and risks. Talk to your health care provider about whether hormone replacement therapy is right for you. Follow these instructions at home:  Schedule regular health, dental, and eye exams.  Stay current with your immunizations.  Do not use any tobacco products including cigarettes, chewing tobacco, or electronic cigarettes.  If you are pregnant, do not drink alcohol.  If you are breastfeeding, limit how much and how often you drink alcohol.  Limit alcohol intake to no more than 1 drink per day for nonpregnant women. One drink equals 12 ounces of beer, 5 ounces of wine, or 1 ounces of hard liquor.  Do not use street drugs.  Do not share needles.  Ask your health care  provider for help if you need support or information about quitting drugs.  Tell your health care provider if you often feel depressed.  Tell your health care provider if you have ever been abused or do not feel safe at home. This information is not intended to replace advice given to you by your health care provider. Make sure you discuss any questions you have with your health care provider. Document Released: 05/08/2011 Document Revised: 03/30/2016 Document Reviewed: 07/27/2015 Elsevier Interactive Patient Education  2018 [employer].     Why follow it? Research shows. . Those who follow the Mediterranean diet have a reduced risk of heart disease  . The diet is associated with a reduced incidence of Parkinson's and Alzheimer's diseases . People following the diet may have longer life expectancies and lower rates of chronic diseases  . The Dietary Guidelines for Americans recommends the Mediterranean diet as an eating plan to promote health and prevent disease  What Is the Mediterranean Diet?  . Healthy eating plan based on typical foods and recipes of Mediterranean-style cooking . The diet is primarily a plant based diet; these foods should make up a majority of meals   Starches - Plant based foods should make up a majority of meals - They are an important sources of vitamins, minerals, energy, antioxidants, and fiber - Choose whole grains, foods high in fiber and minimally processed items  - Typical grain sources include wheat, oats, barley, corn, brown rice, bulgar, farro, millet, polenta, couscous  - Various types of beans include chickpeas, lentils, fava beans, black beans, white beans   Fruits  Veggies - Large quantities of antioxidant rich fruits & veggies; 6 or more servings  - Vegetables can be eaten raw or lightly drizzled with oil and cooked  - Vegetables common to the traditional Mediterranean Diet include: artichokes, arugula, beets, broccoli, brussel sprouts, cabbage,  carrots, celery, collard greens, cucumbers, eggplant, kale, leeks, lemons, lettuce, mushrooms, okra, onions, peas, peppers, potatoes, pumpkin, radishes, rutabaga,  shallots, spinach, sweet potatoes, turnips, zucchini - Fruits common to the Mediterranean Diet include: apples, apricots, avocados, cherries, clementines, dates, figs, grapefruits, grapes, melons, nectarines, oranges, peaches, pears, pomegranates, strawberries, tangerines  Fats - Replace butter and margarine with healthy oils, such as olive oil, canola oil, and tahini  - Limit nuts to no more than a handful a day  - Nuts include walnuts, almonds, pecans, pistachios, pine nuts  - Limit or avoid candied, honey roasted or heavily salted nuts - Olives are central to the [employer] - can be eaten whole or used in a variety of dishes   Meats Protein - Limiting red meat: no more than a few times a month - When eating red meat: choose lean cuts and keep the portion to the size of deck of cards - Eggs: approx. 0 to 4 times a week  - Fish and lean poultry: at least 2 a week  - Healthy protein sources include, chicken, [place], lean beef, lamb - Increase intake of seafood such as tuna, salmon, trout, mackerel, shrimp, scallops - Avoid or limit high fat processed meats such as sausage and bacon  Dairy - Include moderate amounts of low fat dairy products  - Focus on healthy dairy such as fat free yogurt, skim milk, low or reduced fat cheese - Limit dairy products higher in fat such as whole or 2% milk, cheese, ice cream  Alcohol - Moderate amounts of red wine is ok  - No more than 5 oz daily for women (all ages) and men older than age 18  - No more than 10 oz of wine daily for men younger than 98  Other - Limit sweets and other desserts  - Use herbs and spices instead of salt to flavor foods  - Herbs and spices common to the traditional Mediterranean Diet include: basil, bay leaves, chives, cloves, cumin, fennel, garlic, lavender, marjoram,  mint, oregano, parsley, pepper, rosemary, sage, savory, sumac, tarragon, thyme   It's not just a diet, it's a lifestyle:  . The Mediterranean diet includes lifestyle factors typical of those in the region  . Foods, drinks and meals are best eaten with others and savored . Daily physical activity is important for overall good health . This could be strenuous exercise like running and aerobics . This could also be more leisurely activities such as walking, housework, yard-work, or taking the stairs . Moderation is the key; a balanced and healthy diet accommodates most foods and drinks . Consider portion sizes and frequency of consumption of certain foods   Meal Ideas & Options:  . Breakfast:  o Whole wheat toast or whole wheat English muffins with peanut butter & hard boiled egg o Steel cut oats topped with apples & cinnamon and skim milk  o Fresh fruit: banana, strawberries, melon, berries, peaches  o Smoothies: strawberries, bananas, greek yogurt, peanut butter o Low fat greek yogurt with blueberries and granola  o Egg white omelet with spinach and mushrooms o Breakfast couscous: whole wheat couscous, apricots, skim milk, cranberries  . Sandwiches:  o Hummus and grilled vegetables (peppers, zucchini, squash) on whole wheat bread   o Grilled chicken on whole wheat pita with lettuce, tomatoes, cucumbers or tzatziki  o Tuna salad on whole wheat bread: tuna salad made with greek yogurt, olives, red peppers, capers, green onions o Garlic rosemary lamb pita: lamb sauted with garlic, rosemary, salt & pepper; add lettuce, cucumber, greek yogurt to pita - flavor with lemon juice and black pepper  .  Seafood:  o Mediterranean grilled salmon, seasoned with garlic, basil, parsley, lemon juice and black pepper o Shrimp, lemon, and spinach whole-grain pasta salad made with low fat greek yogurt  o Seared scallops with lemon orzo  o Seared tuna steaks seasoned salt, pepper, coriander topped with tomato  mixture of olives, tomatoes, olive oil, minced garlic, parsley, green onions and cappers  . Meats:  o Herbed greek chicken salad with kalamata olives, cucumber, feta  o Red bell peppers stuffed with spinach, bulgur, lean ground beef (or lentils) & topped with feta   o Kebabs: skewers of chicken, tomatoes, onions, zucchini, squash  o Turkey burgers: made with red onions, mint, dill, lemon juice, feta cheese topped with roasted red peppers . Vegetarian o Cucumber salad: cucumbers, artichoke hearts, celery, red onion, feta cheese, tossed in olive oil & lemon juice  o Hummus and whole grain pita points with a greek salad (lettuce, tomato, feta, olives, cucumbers, red onion) o Lentil soup with celery, carrots made with vegetable broth, garlic, salt and pepper  o Tabouli salad: parsley, bulgur, mint, scallions, cucumbers, tomato, radishes, lemon juice, olive oil, salt and pepper.      American Heart Association (AHA) Exercise Recommendation  Being physically active is important to prevent heart disease and stroke, the nation's No. 1and No. 5killers. To improve overall cardiovascular health, we suggest at least 150 minutes per week of moderate exercise or 75 minutes per week of vigorous exercise (or a combination of moderate and vigorous activity). Thirty minutes a day, five times a week is an easy goal to remember. You will also experience benefits even if you divide your time into two or three segments of 10 to 15 minutes per day.  For people who would benefit from lowering their blood pressure or cholesterol, we recommend 40 minutes of aerobic exercise of moderate to vigorous intensity three to four times a week to lower the risk for heart attack and stroke.  Physical activity is anything that makes you move your body and burn calories.  This includes things like climbing stairs or playing sports. Aerobic exercises benefit your heart, and include walking, jogging, swimming or biking. Strength and  stretching exercises are best for overall stamina and flexibility.  The simplest, positive change you can make to effectively improve your heart health is to start walking. It's enjoyable, free, easy, social and great exercise. A walking program is flexible and boasts high success rates because people can stick with it. It's easy for walking to become a regular and satisfying part of life.   For Overall Cardiovascular Health:  At least 30 minutes of moderate-intensity aerobic activity at least 5 days per week for a total of 150  OR   At least 25 minutes of vigorous aerobic activity at least 3 days per week for a total of 75 minutes; or a combination of moderate- and vigorous-intensity aerobic activity  AND   Moderate- to high-intensity muscle-strengthening activity at least 2 days per week for additional health benefits.  For Lowering Blood Pressure and Cholesterol  An average 40 minutes of moderate- to vigorous-intensity aerobic activity 3 or 4 times per week  What if I can't make it to the time goal? Something is always better than nothing! And everyone has to start somewhere. Even if you've been sedentary for years, today is the day you can begin to make healthy changes in your life. If you don't think you'll make it for 30 or 40 minutes, set a reachable goal for   today. You can work up toward your overall goal by increasing your time as you get stronger. Don't let all-or-nothing thinking rob you of doing what you can every day.  Source:http://www.heart.org    

## 2018-01-19 LAB — IGP,RFX APTIMA HPV ALL PTH: PAP Smear Comment: 0

## 2018-09-23 ENCOUNTER — Telehealth: Payer: Self-pay

## 2018-09-23 NOTE — Telephone Encounter (Signed)
Can you rx this for her?

## 2018-09-23 NOTE — Telephone Encounter (Signed)
Pt calling to see if she can go back on the nuvaring.  She has been on it before.  Does she need an appt for this or can we just sent in rx?  [phone]

## 2018-09-24 ENCOUNTER — Other Ambulatory Visit: Payer: Self-pay | Admitting: Obstetrics & Gynecology

## 2018-09-24 ENCOUNTER — Other Ambulatory Visit: Payer: Self-pay

## 2018-09-24 MED ORDER — ETONOGESTREL-ETHINYL ESTRADIOL 0.12-0.015 MG/24HR VA RING: VAGINAL_RING | VAGINAL | 11 refills | Status: DC

## 2018-09-24 NOTE — Telephone Encounter (Signed)
[facility] [place]

## 2018-09-24 NOTE — Telephone Encounter (Signed)
[facility] aware [facility] [place]

## 2018-12-30 ENCOUNTER — Ambulatory Visit (INDEPENDENT_AMBULATORY_CARE_PROVIDER_SITE_OTHER): Payer: Managed Care, Other (non HMO) | Admitting: Physician Assistant

## 2018-12-30 ENCOUNTER — Encounter: Payer: Self-pay | Admitting: Physician Assistant

## 2018-12-30 VITALS — BP 123/82 | HR 77 | Temp 98.0°F | Resp 16 | Ht 64.0 in | Wt 118.4 lb

## 2018-12-30 DIAGNOSIS — F908 Attention-deficit hyperactivity disorder, other type: Secondary | ICD-10-CM | POA: Diagnosis not present

## 2018-12-30 DIAGNOSIS — Z23 Encounter for immunization: Secondary | ICD-10-CM | POA: Diagnosis not present

## 2018-12-30 DIAGNOSIS — F419 Anxiety disorder, unspecified: Secondary | ICD-10-CM

## 2018-12-30 MED ORDER — AMPHETAMINE SULFATE 10 MG PO TABS: 10.0000 mg | ORAL_TABLET | Freq: Two times a day (BID) | ORAL | 0 refills | Status: DC

## 2018-12-30 NOTE — Patient Instructions (Addendum)
Magnesium oxide 400 mg once daily B2 (pyridoxine)  100 mg daily  Keep a headache log - triggers, food, activities    Attention Deficit Hyperactivity Disorder, Adult Attention deficit hyperactivity disorder (ADHD) is a mental health disorder that starts during childhood. For many people with ADHD, the disorder continues into adult years. There are many things that you and your health care provider or therapist (mental health professional) can do to manage your symptoms. What are the causes? The exact cause of ADHD is not known. What increases the risk? You are more likely to develop this condition if:  You have a family history of ADHD.  You are female.  You were born to a mother who smoked or drank alcohol during pregnancy.  You were exposed to lead poisoning or other toxins in the womb or in early life.  You were born before 37 weeks of pregnancy (prematurely) or you had a low birth weight.  You have experienced a brain injury. What are the signs or symptoms? Symptoms of this condition depend on the type of ADHD. The two main types are inattentive and hyperactive-impulsive. Some people may have symptoms of both types. Symptoms of the inattentive type include:  Difficulty watching, listening, or thinking with focused effort (paying attention).  Making careless mistakes.  Not listening.  Not following instructions.  Being disorganized.  Avoiding tasks that require time and attention.  Losing things.  Forgetting things.  Being easily distracted. Symptoms of the hyperactive-impulsive type include:  Restlessness.  Talking too much.  Interrupting.  Difficulty with: ? Sitting still. ? Staying quiet. ? Feeling motivated. ? Relaxing. ? Waiting in line or waiting for a turn. How is this diagnosed? This condition is diagnosed based on your current symptoms and your history of symptoms. The diagnosis can be made by a provider such as a primary care provider,  psychiatrist, psychologist, or clinical social worker. The provider may use a symptom checklist or a standardized behavior rating scale to evaluate your symptoms. He or she may want to talk with family members who have known you for a long time and have observed your behaviors. There are no lab tests or brain imaging tests that can diagnose ADHD. How is this treated? This condition can be treated with medicines and behavior therapy. Medicines may be the best option to reduce impulsive behaviors and improve attention. Your health care provider may recommend:  Stimulant medicines. These are the most common medicines used for adult ADHD. They affect certain chemicals in the brain (neurotransmitters). These medicines may be long-acting or short-acting. This will determine how often you need to take the medicine.  A non-stimulant medicine for adult ADHD (atomoxetine). This medicine increases a neurotransmitter called norepinephrine. It may take weeks to months to see effects from this medicine. Psychotherapy and behavioral management are also important for treating ADHD. Psychotherapy is often used along with medicine. Your health care provider may suggest:  Cognitive behavioral therapy (CBT). This type of therapy teaches you to replace negative thoughts and actions with positive thoughts and actions. When used as part of ADHD treatment, this therapy may also include: ? Coping strategies for organization, time management, impulse control, and stress reduction. ? Mindfulness and meditation training.  Behavioral management. This may include strategies for organization and time management. You may work with an ADHD coach who is specially trained to help people with ADHD to manage and organize activities and to function more effectively. Follow these instructions at home: Medicines   Take over-the-counter and  prescription medicines only as told by your health care provider.  Talk with your health care  provider about the possible side effects of your medicine to watch for. General instructions   Learn as much as you can about adult ADHD, and work closely with your health care providers to find the treatments that work best for you.  Do not use drugs or abuse alcohol. Limit alcohol intake to no more than 1 drink a day for nonpregnant women and 2 drinks a day for men. One drink equals 12 oz of beer, 5 oz of wine, or 1 oz of hard liquor.  Follow the same schedule each day. Make sure your schedule includes enough time for you to get plenty of sleep.  Use reminder devices like notes, calendars, and phone apps to stay on-time and organized.  Eat a healthy diet. Do not skip meals.  Exercise regularly. Exercise can help to reduce stress and anxiety.  Keep all follow-up visits as told by your health care provider and therapist. This is important. Where to find more information  A health care provider may be able to recommend resources that are available online or over the phone. You could start with: ? Attention Deficit Disorder Association (ADDA): http://davis-dillon.net/ ? [employer] of Mental Health [facility]): http://www.maynard.net/ Contact a health care provider if:  Your symptoms are changing, getting worse, or not improving.  You have side effects from your medicine, such as: ? Repeated muscle twitches, coughing, or speech outbursts. ? Sleep problems. ? Loss of appetite. ? Depression. ? New or worsening behavior problems. ? Dizziness. ? Unusually fast heartbeat. ? Stomach pains. ? Headaches.  You are struggling with anxiety, depression, or substance abuse. Get help right away if:  You have a severe reaction to a medicine.  You have thoughts of hurting yourself or others. If you ever feel like you may hurt yourself or others, or have thoughts about taking your own life, get help right away. You can go to the nearest emergency department or call:  Your local emergency services (911 in  the [place]  A suicide crisis helpline, such as the National Suicide Prevention Lifeline at 406-197-3527. This is open 24 hours a day. Summary  ADHD is a mental health disorder that starts during childhood and often continues into adult years.  The exact cause of ADHD is not known.  There is no cure for ADHD, but treatment with medicine, therapy, or behavioral training can help you manage your condition. This information is not intended to replace advice given to you by your health care provider. Make sure you discuss any questions you have with your health care provider. Document Released: 06/14/2017 Document Revised: 06/14/2017 Document Reviewed: 06/14/2017 Elsevier Interactive Patient Education  2019 [employer].

## 2018-12-30 NOTE — Progress Notes (Signed)
Patient: Joyce Wallace, Female    DOB: 10-Nov-1990, 28 y.o.   MRN: [id number] Visit Date: 12/30/2018  Today's Provider: Trey Sailors, PA-C   Chief Complaint  Patient presents with  . New Patient (Initial Visit)   Subjective:     Annual physical exam [name] is a 28 y.o. female who presents today to establish care. Patient is looking for a provider that can manage her prescription medication.  She is living in [place] with her husband. 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 She does not have any children. She is followed by [facility]. She uses NuvaRing for contraception. She had a PAP on 01/19/2018 that was normal.    Patient reports she was being treated for ADHD by Tamela Oddi, PA-C at [facility] in [place]. She has been taking Evekeo 10 mg twice daily for several years. She has been stable on this. Denies chest pain, palpitations, insomnia, weight loss, increased anxiety.  She is on celexa 20 mg daily for anxiety, doing well on this.   -----------------------------------------------------------------   Review of Systems  Constitutional: Positive for activity change, appetite change, diaphoresis, fatigue and unexpected weight change.  HENT: Negative.   Eyes: Negative.   Respiratory: Negative.   Cardiovascular: Negative.   Gastrointestinal: Negative.   Endocrine: Negative.   Genitourinary: Negative.   Musculoskeletal: Negative.   Skin: Negative.   Allergic/Immunologic: Positive for environmental allergies.  Neurological: Positive for headaches.  Hematological: Negative.   Psychiatric/Behavioral: Positive for decreased concentration, self-injury, sleep disturbance and suicidal ideas. The patient is nervous/anxious and is hyperactive.     Social History      She  reports that she has never smoked. She has never used smokeless tobacco. She reports current alcohol use. She reports that she does not use drugs.       Social  History   Socioeconomic History  . Marital status: Married    Spouse name: Not on file  . Number of children: Not on file  . Years of education: Not on file  . Highest education level: Not on file  Occupational History  . Not on file  Social Needs  . Financial resource strain: Not on file  . Food insecurity:    Worry: Not on file    Inability: Not on file  . Transportation needs:    Medical: Not on file    Non-medical: Not on file  Tobacco Use  . Smoking status: Never Smoker  . Smokeless tobacco: Never Used  Substance and Sexual Activity  . Alcohol use: Yes    Comment: occassionally  . Drug use: No  . Sexual activity: Yes    Birth control/protection: Pill  Lifestyle  . Physical activity:    Days per week: Not on file    Minutes per session: Not on file  . Stress: Not on file  Relationships  . Social connections:    Talks on phone: Not on file    Gets together: Not on file    Attends religious service: Not on file    Active member of club or organization: Not on file    Attends meetings of clubs or organizations: Not on file    Relationship status: Not on file  Other Topics Concern  . Not on file  Social History Narrative  . Not on file    Past Medical History:  Diagnosis Date  . ADD (attention deficit disorder)   . Allergy   .  Anxiety   . Depression   . Herpes genitalis      There are no active problems to display for this patient.   Past Surgical History:  Procedure Laterality Date  . WISDOM TOOTH EXTRACTION      Family History        Family Status  Relation Name Status  . MGF  (Not Specified)  . Mother  Alive  . MGM  (Not Specified)        Her family history includes ADD / ADHD in her maternal grandmother; Anxiety disorder in her maternal grandmother; Depression in her maternal grandmother; Hyperlipidemia in her maternal grandfather; Hypertension in her maternal grandfather; Rheum arthritis in her mother.      Allergies  Allergen Reactions   . Amoxicillin     rash     Current Outpatient Medications:  .  citalopram (CELEXA) 20 MG tablet, , Disp: , Rfl:  .  Cyanocobalamin (VITAMIN B 12 PO), Take by mouth., Disp: , Rfl:  .  etonogestrel-ethinyl estradiol (NUVARING) 0.12-0.015 MG/24HR vaginal ring, Insert vaginally and leave in place for 3 consecutive weeks, then remove for 1 week., Disp: 1 each, Rfl: 11 .  EVEKEO 10 MG TABS, Take 1 tablet by mouth 2 (two) times daily., Disp: , Rfl: 0 .  Multiple Vitamin (MULTIVITAMIN) tablet, Take 1 tablet by mouth daily., Disp: , Rfl:  .  Omega-3 Fatty Acids (FISH OIL PO), Take by mouth., Disp: , Rfl:    Patient Care Team: Maryella Shivers as PCP - General (Physician Assistant)    Objective:    Vitals: BP 123/82 (BP Location: Left Arm, Patient Position: Sitting, Cuff Size: Normal)   Pulse 77   Temp 98 F (36.7 C) (Oral)   Resp 16   Ht 5\' 4"  (1.626 m)   Wt 118 lb 6.4 oz (53.7 kg)   LMP 12/21/2018   BMI 20.32 kg/m    Vitals:   12/30/18 1408  BP: 123/82  Pulse: 77  Resp: 16  Temp: 98 F (36.7 C)  TempSrc: Oral  Weight: 118 lb 6.4 oz (53.7 kg)  Height: 5\' 4"  (1.626 m)     Physical Exam Constitutional:      Appearance: Normal appearance.  Cardiovascular:     Rate and Rhythm: Normal rate and regular rhythm.     Heart sounds: Normal heart sounds.  Pulmonary:     Effort: Pulmonary effort is normal.     Breath sounds: Normal breath sounds.  Skin:    General: Skin is warm and dry.  Neurological:     Mental Status: She is alert and oriented to person, place, and time. Mental status is at baseline.  Psychiatric:        Mood and Affect: Mood normal.        Behavior: Behavior normal.      Depression Screen PHQ 2/9 Scores 12/30/2018  PHQ - 2 Score 5  PHQ- 9 Score 21       Assessment & Plan:     Routine Health Maintenance and Physical Exam  Exercise Activities and Dietary recommendations Goals   None      There is no immunization history on file for  this patient.  Health Maintenance  Topic Date Due  . HIV Screening  08/27/2007  . TETANUS/TDAP  08/27/2011  . INFLUENZA VACCINE  06/06/2018  . PAP-Cervical Cytology Screening  05/17/2019  . PAP SMEAR-Modifier  01/17/2021     Discussed health benefits of physical activity, and encouraged her  to engage in regular exercise appropriate for her age and condition.    1. Attention deficit hyperactivity disorder (ADHD), other type  Requested and received records from [facility]. Have reviewed diagnosis and treatment of ADHD. Will continue to fill script with Q4 month followups. Have reviewed NCCSRS and there is single provider, consistent pharmacy usage and no early refills.   - Amphetamine Sulfate (EVEKEO) 10 MG TABS; Take 10 mg by mouth 2 (two) times daily.  Dispense: 60 tablet; Refill: 0  2. Anxiety  Refill Celexa 20 mg QD when she runs out.   3. Need for Td vaccine  - Td vaccine greater than or equal to 7yo preservative free IM  --------------------------------------------------------------------    Trey Sailors, PA-C  [facility]

## 2019-01-01 ENCOUNTER — Telehealth: Payer: Self-pay | Admitting: Physician Assistant

## 2019-01-01 NOTE — Telephone Encounter (Signed)
Can we call patient and let her know that I received records from [facility] and I will fill ADHD medication until her next appointment.

## 2019-01-01 NOTE — Telephone Encounter (Signed)
Left detailed message on voicemail.  

## 2019-01-02 ENCOUNTER — Other Ambulatory Visit: Payer: Self-pay | Admitting: Physician Assistant

## 2019-01-02 DIAGNOSIS — F908 Attention-deficit hyperactivity disorder, other type: Secondary | ICD-10-CM

## 2019-01-02 NOTE — Telephone Encounter (Signed)
°  Amphetamine Sulfate (EVEKEO) 10 MG TABS needing a refill citalopram (CELEXA) 20 MG tablet Needing refills  Please fill at:  [id number] [place], [place] - [address] [phone] (Phone) [phone] (Fax)    Thanks, [employer]

## 2019-01-03 MED ORDER — AMPHETAMINE SULFATE 10 MG PO TABS: 10.0000 mg | ORAL_TABLET | Freq: Two times a day (BID) | ORAL | 0 refills | Status: DC

## 2019-01-06 ENCOUNTER — Other Ambulatory Visit: Payer: Self-pay | Admitting: Physician Assistant

## 2019-01-06 DIAGNOSIS — F419 Anxiety disorder, unspecified: Secondary | ICD-10-CM

## 2019-01-06 MED ORDER — CITALOPRAM HYDROBROMIDE 20 MG PO TABS
20.0000 mg | ORAL_TABLET | Freq: Every day | ORAL | 1 refills | Status: DC
Start: 2019-01-06 — End: 2019-07-16

## 2019-01-06 NOTE — Telephone Encounter (Signed)
Pt called saying she received the rx for the adderall but not the Celexa  Can we fill the Celexa 20 mg?  [facility]  [employer] [employer]

## 2019-01-06 NOTE — Telephone Encounter (Signed)
Sent 90 days with one refill.

## 2019-01-20 ENCOUNTER — Ambulatory Visit: Payer: Managed Care, Other (non HMO) | Admitting: Physician Assistant

## 2019-02-09 ENCOUNTER — Other Ambulatory Visit: Payer: Self-pay | Admitting: Physician Assistant

## 2019-02-09 DIAGNOSIS — F908 Attention-deficit hyperactivity disorder, other type: Secondary | ICD-10-CM

## 2019-02-10 MED ORDER — AMPHETAMINE SULFATE 10 MG PO TABS: 10.0000 mg | ORAL_TABLET | Freq: Two times a day (BID) | ORAL | 0 refills | Status: DC

## 2019-03-23 ENCOUNTER — Other Ambulatory Visit: Payer: Self-pay | Admitting: Physician Assistant

## 2019-03-23 DIAGNOSIS — F908 Attention-deficit hyperactivity disorder, other type: Secondary | ICD-10-CM

## 2019-03-24 MED ORDER — AMPHETAMINE SULFATE 10 MG PO TABS: 10.0000 mg | ORAL_TABLET | Freq: Two times a day (BID) | ORAL | 0 refills | Status: DC

## 2019-03-24 NOTE — Telephone Encounter (Signed)
Please review

## 2019-04-25 ENCOUNTER — Other Ambulatory Visit: Payer: Self-pay

## 2019-04-25 ENCOUNTER — Ambulatory Visit (INDEPENDENT_AMBULATORY_CARE_PROVIDER_SITE_OTHER): Payer: 59 | Admitting: Family Medicine

## 2019-04-25 ENCOUNTER — Encounter: Payer: Self-pay | Admitting: Family Medicine

## 2019-04-25 VITALS — BP 96/60 | HR 81 | Temp 98.6°F | Resp 16 | Wt 120.2 lb

## 2019-04-25 DIAGNOSIS — T148XXA Other injury of unspecified body region, initial encounter: Secondary | ICD-10-CM

## 2019-04-25 NOTE — Progress Notes (Signed)
Patient: Joyce Wallace Female    DOB: 20-Mar-1991   28 y.o.   MRN: [id number] Visit Date: 04/25/2019  Today's Provider: Vernie Murders, PA   Chief Complaint  Patient presents with  . Bleeding/Bruising   Subjective:     HPI  Patient comes in office today to address concerns of easily bruising that has been more noticeable over the past month. Patient reports that yesterday while she was in store she began itching her right thigh and noticed when she got home that area seems to be swelling. Patient reports over night swelling continued to spread up her thigh. Patient reports skin discoloration , itching, and pain to the touch around site.   Past Medical History:  Diagnosis Date  . ADD (attention deficit disorder)   . Allergy   . Anxiety   . Depression   . Herpes genitalis    Past Surgical History:  Procedure Laterality Date  . WISDOM TOOTH EXTRACTION     Family History  Problem Relation Age of Onset  . Hyperlipidemia Maternal Grandfather   . Hypertension Maternal Grandfather   . Rheum arthritis Mother   . Anxiety disorder Maternal Grandmother   . Depression Maternal Grandmother   . ADD / ADHD Maternal Grandmother    Allergies  Allergen Reactions  . Amoxicillin     rash    Current Outpatient Medications:  .  Amphetamine Sulfate (EVEKEO) 10 MG TABS, Take 10 mg by mouth 2 (two) times daily., Disp: 60 tablet, Rfl: 0 .  citalopram (CELEXA) 20 MG tablet, Take 1 tablet (20 mg total) by mouth daily., Disp: 90 tablet, Rfl: 1 .  Cyanocobalamin (VITAMIN B 12 PO), Take by mouth., Disp: , Rfl:  .  etonogestrel-ethinyl estradiol (NUVARING) 0.12-0.015 MG/24HR vaginal ring, Insert vaginally and leave in place for 3 consecutive weeks, then remove for 1 week., Disp: 1 each, Rfl: 11 .  Multiple Vitamin (MULTIVITAMIN) tablet, Take 1 tablet by mouth daily., Disp: , Rfl:  .  Omega-3 Fatty Acids (FISH OIL PO), Take by mouth., Disp: , Rfl:   Review of Systems   Constitutional: Negative.   HENT: Negative.   Gastrointestinal: Negative.   Endocrine: Negative.   Genitourinary: Negative.   Musculoskeletal: Positive for arthralgias.  Skin: Positive for color change and rash.  Allergic/Immunologic: Negative for environmental allergies and food allergies.  Neurological: Negative.   Hematological: Bruises/bleeds easily.   Social History   Tobacco Use  . Smoking status: Never Smoker  . Smokeless tobacco: Never Used  Substance Use Topics  . Alcohol use: Yes    Comment: occassionally     Objective:   BP 96/60   Pulse 81   Temp 98.6 F (37 C) (Oral)   Resp 16   Wt 120 lb 3.2 oz (54.5 kg)   BMI 20.63 kg/m  Vitals:   04/25/19 1525  BP: 96/60  Pulse: 81  Resp: 16  Temp: 98.6 F (37 C)  TempSrc: Oral  Weight: 120 lb 3.2 oz (54.5 kg)   Physical Exam Constitutional:      General: She is not in acute distress.    Appearance: She is well-developed.  HENT:     Head: Normocephalic and atraumatic.     Right Ear: Hearing normal.     Left Ear: Hearing normal.     Nose: Nose normal.  Eyes:     General: Lids are normal. No scleral icterus.       Right eye: No discharge.  Left eye: No discharge.     Conjunctiva/sclera: Conjunctivae normal.  Pulmonary:     Effort: Pulmonary effort is normal. No respiratory distress.  Musculoskeletal: Normal range of motion.  Skin:    Findings: Rash present. No lesion.     Comments: Dark tan and slightly tender lesion with central clearing on the right lateral thigh in an area 10 x 5 cm. Few spider-like spots on the anterior thigh. No local lymphadenopathy.  Neurological:     Mental Status: She is alert and oriented to person, place, and time.  Psychiatric:        Speech: Speech normal.        Behavior: Behavior normal.        Thought Content: Thought content normal.       Assessment & Plan    1. Bruising Started with an erythematous rash on the right lateral thing nearly a month ago.  Yesterday it became itchy and had changed to and tender brown area. No known injury. No epistaxis, gums bleeding or hematuria. LMP started a week ago and is tapering off - regular cycle and menses pattern unchanged. Does not take any BCP and not trying to get pregnant now. Will check CBC and PT with PTT for bleeding disorder. May have been a vasculitis secondary to an allergic reaction or an unrecognized trauma. Denies any new medications, lotions, soaps, detergents or significant diet changes.  May use Benadryl for itching and recheck pending lab reports. - CBC with Differential/Platelet - PT and PTT     Dortha Kernennis Macel Yearsley, PA  [facility] Leo RodI,Kathleen J [place],acting as a Neurosurgeonscribe for [employer] Southern[employer] Griff Badley, PA.,have documented all relevant documentation on the behalf of [employer] SouthernDennis Fe Okubo, PA,as directed by  [employer]ennis Radek Carnero, PA while in the presence of [employer] SouthernDennis Jonathin Heinicke, [place].

## 2019-04-30 ENCOUNTER — Telehealth (INDEPENDENT_AMBULATORY_CARE_PROVIDER_SITE_OTHER): Payer: 59 | Admitting: Physician Assistant

## 2019-04-30 DIAGNOSIS — F419 Anxiety disorder, unspecified: Secondary | ICD-10-CM | POA: Insufficient documentation

## 2019-04-30 DIAGNOSIS — F908 Attention-deficit hyperactivity disorder, other type: Secondary | ICD-10-CM

## 2019-04-30 DIAGNOSIS — F909 Attention-deficit hyperactivity disorder, unspecified type: Secondary | ICD-10-CM | POA: Diagnosis not present

## 2019-04-30 MED ORDER — AMPHETAMINE SULFATE 10 MG PO TABS: 10.0000 mg | ORAL_TABLET | Freq: Two times a day (BID) | ORAL | 0 refills | Status: DC

## 2019-04-30 NOTE — Progress Notes (Signed)
Patient: Joyce Wallace Female    DOB: Jan 28, 1991   28 y.o.   MRN: [id number] Visit Date: 04/30/2019  Today's Provider: Trinna Post, PA-C   Chief Complaint  Patient presents with  . ADHD   Subjective:    Virtual Visit via Telephone Note  I connected with Joyce Wallace on 04/30/19 at  8:00 AM EDT by telephone and verified that I am speaking with the correct person using two identifiers.   I discussed the limitations, risks, security and privacy concerns of performing an evaluation and management service by telephone and the availability of in person appointments. I also discussed with the patient that there may be a patient responsible charge related to this service. The patient expressed understanding and agreed to proceed.  Patient location: home Provider location: [place] office  Persons involved in the visit: patient, provider   HPI  Follow up for ADHD  The patient was last seen for this 4 months ago. Changes made at last visit include no changes. Currently taking Evekeo 10 mg twice daily. Has had formal psychology evaluation for ADHD, records under media tab.   She reports excellent compliance with treatment. She feels that condition is Improved. She is not having side effects.   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 Currently living in hotel, trying to close on house.     ------------------------------------------------------------------------------------   Allergies  Allergen Reactions  . Amoxicillin     rash     Current Outpatient Medications:  .  Amphetamine Sulfate (EVEKEO) 10 MG TABS, Take 10 mg by mouth 2 (two) times daily., Disp: 60 tablet, Rfl: 0 .  citalopram (CELEXA) 20 MG tablet, Take 1 tablet (20 mg total) by mouth daily., Disp: 90 tablet, Rfl: 1 .  Cyanocobalamin (VITAMIN B 12 PO), Take by mouth., Disp: , Rfl:  .  etonogestrel-ethinyl  estradiol (NUVARING) 0.12-0.015 MG/24HR vaginal ring, Insert vaginally and leave in place for 3 consecutive weeks, then remove for 1 week., Disp: 1 each, Rfl: 11 .  Multiple Vitamin (MULTIVITAMIN) tablet, Take 1 tablet by mouth daily., Disp: , Rfl:  .  Omega-3 Fatty Acids (FISH OIL PO), Take by mouth., Disp: , Rfl:   Review of Systems  Social History   Tobacco Use  . Smoking status: Never Smoker  . Smokeless tobacco: Never Used  Substance Use Topics  . Alcohol use: Yes    Comment: occassionally      Objective:   There were no vitals taken for this visit. There were no vitals filed for this visit.   Physical Exam   No results found for any visits on 04/30/19.     Assessment & Plan    1. Attention deficit hyperactivity disorder (ADHD), unspecified ADHD type  Reviewed NCCSRS. Continue current medication. Send to new pharmacy in [place], [place] closer to her work. F/u 4 months.  2. Attention deficit hyperactivity disorder (ADHD), other type  - Amphetamine Sulfate (EVEKEO) 10 MG TABS; Take 10 mg by mouth 2 (two) times daily.  Dispense: 60 tablet; Refill: 0  3. Anxiety  Continue Celexa 20 mg daily.  The entirety of the information documented in the History of Present Illness, Review of Systems and Physical Exam were personally obtained by me. Portions of this information were initially documented by Lynford Humphrey, CMA and reviewed by me for thoroughness and accuracy.        Trinna Post,  PA-C  [facility]

## 2019-04-30 NOTE — Patient Instructions (Signed)
Attention Deficit Hyperactivity Disorder, Adult Attention deficit hyperactivity disorder (ADHD) is a mental health disorder that starts during childhood. For many people with ADHD, the disorder continues into adult years. There are many things that you and your health care provider or therapist (mental health professional) can do to manage your symptoms. What are the causes? The exact cause of ADHD is not known. What increases the risk? You are more likely to develop this condition if:  You have a family history of ADHD.  You are female.  You were born to a mother who smoked or drank alcohol during pregnancy.  You were exposed to lead poisoning or other toxins in the womb or in early life.  You were born before 37 weeks of pregnancy (prematurely) or you had a low birth weight.  You have experienced a brain injury. What are the signs or symptoms? Symptoms of this condition depend on the type of ADHD. The two main types are inattentive and hyperactive-impulsive. Some people may have symptoms of both types. Symptoms of the inattentive type include:  Difficulty watching, listening, or thinking with focused effort (paying attention).  Making careless mistakes.  Not listening.  Not following instructions.  Being disorganized.  Avoiding tasks that require time and attention.  Losing things.  Forgetting things.  Being easily distracted. Symptoms of the hyperactive-impulsive type include:  Restlessness.  Talking too much.  Interrupting.  Difficulty with: ? Sitting still. ? Staying quiet. ? Feeling motivated. ? Relaxing. ? Waiting in line or waiting for a turn. How is this diagnosed? This condition is diagnosed based on your current symptoms and your history of symptoms. The diagnosis can be made by a provider such as a primary care provider, psychiatrist, psychologist, or clinical social worker. The provider may use a symptom checklist or a standardized behavior rating  scale to evaluate your symptoms. He or she may want to talk with family members who have known you for a long time and have observed your behaviors. There are no lab tests or brain imaging tests that can diagnose ADHD. How is this treated? This condition can be treated with medicines and behavior therapy. Medicines may be the best option to reduce impulsive behaviors and improve attention. Your health care provider may recommend:  Stimulant medicines. These are the most common medicines used for adult ADHD. They affect certain chemicals in the brain (neurotransmitters). These medicines may be long-acting or short-acting. This will determine how often you need to take the medicine.  A non-stimulant medicine for adult ADHD (atomoxetine). This medicine increases a neurotransmitter called norepinephrine. It may take weeks to months to see effects from this medicine. Psychotherapy and behavioral management are also important for treating ADHD. Psychotherapy is often used along with medicine. Your health care provider may suggest:  Cognitive behavioral therapy (CBT). This type of therapy teaches you to replace negative thoughts and actions with positive thoughts and actions. When used as part of ADHD treatment, this therapy may also include: ? Coping strategies for organization, time management, impulse control, and stress reduction. ? Mindfulness and meditation training.  Behavioral management. This may include strategies for organization and time management. You may work with an ADHD coach who is specially trained to help people with ADHD to manage and organize activities and to function more effectively. Follow these instructions at home: Medicines   Take over-the-counter and prescription medicines only as told by your health care provider.  Talk with your health care provider about the possible side effects of your   medicine to watch for. General instructions   Learn as much as you can about  adult ADHD, and work closely with your health care providers to find the treatments that work best for you.  Do not use drugs or abuse alcohol. Limit alcohol intake to no more than 1 drink a day for nonpregnant women and 2 drinks a day for men. One drink equals 12 oz of beer, 5 oz of wine, or 1 oz of hard liquor.  Follow the same schedule each day. Make sure your schedule includes enough time for you to get plenty of sleep.  Use reminder devices like notes, calendars, and phone apps to stay on-time and organized.  Eat a healthy diet. Do not skip meals.  Exercise regularly. Exercise can help to reduce stress and anxiety.  Keep all follow-up visits as told by your health care provider and therapist. This is important. Where to find more information  A health care provider may be able to recommend resources that are available online or over the phone. You could start with: ? Attention Deficit Disorder Association (ADDA): www.add.org ? National Institute of Mental Health (NIMH): www.nimh.nih.gov Contact a health care provider if:  Your symptoms are changing, getting worse, or not improving.  You have side effects from your medicine, such as: ? Repeated muscle twitches, coughing, or speech outbursts. ? Sleep problems. ? Loss of appetite. ? Depression. ? New or worsening behavior problems. ? Dizziness. ? Unusually fast heartbeat. ? Stomach pains. ? Headaches.  You are struggling with anxiety, depression, or substance abuse. Get help right away if:  You have a severe reaction to a medicine.  You have thoughts of hurting yourself or others. If you ever feel like you may hurt yourself or others, or have thoughts about taking your own life, get help right away. You can go to the nearest emergency department or call:  Your local emergency services (911 in the [place]  A suicide crisis helpline, such as the National Suicide Prevention Lifeline at 1-800-273-8255. This is open 24 hours a  day. Summary  ADHD is a mental health disorder that starts during childhood and often continues into adult years.  The exact cause of ADHD is not known.  There is no cure for ADHD, but treatment with medicine, therapy, or behavioral training can help you manage your condition. This information is not intended to replace advice given to you by your health care provider. Make sure you discuss any questions you have with your health care provider. Document Released: 06/14/2017 Document Revised: 06/14/2017 Document Reviewed: 06/14/2017 Elsevier Interactive Patient Education  2019 Elsevier Inc.  

## 2019-05-05 ENCOUNTER — Telehealth: Payer: Self-pay | Admitting: Physician Assistant

## 2019-05-05 DIAGNOSIS — R319 Hematuria, unspecified: Secondary | ICD-10-CM

## 2019-05-05 NOTE — Telephone Encounter (Signed)
Fax to [place] if UA and urine culture are approved.

## 2019-05-05 NOTE — Telephone Encounter (Signed)
Orders placed and patient was advised.

## 2019-05-05 NOTE — Telephone Encounter (Signed)
May add urinalysis and urine culture order for hematuria. If she is planning to stay in [place], probably should consider getting established with a provider closer to her.

## 2019-05-05 NOTE — Telephone Encounter (Signed)
Pt came in last week and seen Simona Huh.  He ordered lab for bruising.  She has not had the labs done yet.  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  She noticed she is having some blood in her urine and a little pain lower and.  She wants to know if Simona Huh can add a UA and UC to that order.  CB#  [phone]  teri

## 2019-05-06 LAB — CBC WITH DIFFERENTIAL/PLATELET
Basophils Absolute: 0 10*3/uL (ref 0.0–0.2)
Basos: 1 %
EOS (ABSOLUTE): 0.2 10*3/uL (ref 0.0–0.4)
Eos: 2 %
Hematocrit: 38.5 % (ref 34.0–46.6)
Hemoglobin: 12.9 g/dL (ref 11.1–15.9)
Immature Grans (Abs): 0 10*3/uL (ref 0.0–0.1)
Immature Granulocytes: 0 %
Lymphocytes Absolute: 2.5 10*3/uL (ref 0.7–3.1)
Lymphs: 32 %
MCH: 30.1 pg (ref 26.6–33.0)
MCHC: 33.5 g/dL (ref 31.5–35.7)
MCV: 90 fL (ref 79–97)
Monocytes Absolute: 0.4 10*3/uL (ref 0.1–0.9)
Monocytes: 6 %
Neutrophils Absolute: 4.9 10*3/uL (ref 1.4–7.0)
Neutrophils: 59 %
Platelets: 291 10*3/uL (ref 150–450)
RBC: 4.28 x10E6/uL (ref 3.77–5.28)
RDW: 11.8 % (ref 11.7–15.4)
WBC: 8.1 10*3/uL (ref 3.4–10.8)

## 2019-05-06 LAB — PT AND PTT
INR: 1 (ref 0.8–1.2)
Prothrombin Time: 10.6 s (ref 9.1–12.0)
aPTT: 28 s (ref 24–33)

## 2019-05-19 ENCOUNTER — Telehealth: Payer: Self-pay | Admitting: Physician Assistant

## 2019-05-19 NOTE — Telephone Encounter (Signed)
Td given 12/30/2018. Pt advised

## 2019-05-19 NOTE — Telephone Encounter (Signed)
Pt wants to know if her tetanus is up to date.  Her dog bit her but she cleaned it and know his vaccines are up to date.  CB#  [phone]  teri

## 2019-06-20 ENCOUNTER — Other Ambulatory Visit: Payer: Self-pay | Admitting: Physician Assistant

## 2019-06-20 DIAGNOSIS — F908 Attention-deficit hyperactivity disorder, other type: Secondary | ICD-10-CM

## 2019-06-20 MED ORDER — AMPHETAMINE SULFATE 10 MG PO TABS: 10.0000 mg | ORAL_TABLET | Freq: Two times a day (BID) | ORAL | 0 refills | Status: DC

## 2019-06-23 ENCOUNTER — Encounter: Payer: Self-pay | Admitting: Physician Assistant

## 2019-06-23 NOTE — Telephone Encounter (Signed)
Is there a PA? Can we complete please.

## 2019-06-24 ENCOUNTER — Telehealth: Payer: Self-pay | Admitting: Physician Assistant

## 2019-06-24 NOTE — Telephone Encounter (Signed)
Patient is asking if the prior auth. For her Joyce Wallace has been sent back to ins?  Patient is almost out of meds and needs this.

## 2019-06-25 ENCOUNTER — Encounter: Payer: Self-pay | Admitting: Physician Assistant

## 2019-06-25 DIAGNOSIS — F909 Attention-deficit hyperactivity disorder, unspecified type: Secondary | ICD-10-CM

## 2019-06-25 NOTE — Telephone Encounter (Signed)
PA approved see other note in chart.

## 2019-06-25 NOTE — Telephone Encounter (Signed)
Pt calling back stating she is completely out of the Amphetamine Sulfate (EVEKEO) 10 MG TABS.  Pharmacy is telling pt her new insurance is still needing a prior author to have the medication covered and filled.  Please advise asap.  Thanks, [employer]

## 2019-06-25 NOTE — Telephone Encounter (Addendum)
PA was approved till 06/19/2020.  Ref# [id number]. Pharmacy was also advised.

## 2019-06-26 MED ORDER — AMPHETAMINE-DEXTROAMPHETAMINE 10 MG PO TABS: 10.0000 mg | ORAL_TABLET | Freq: Two times a day (BID) | ORAL | 0 refills | Status: DC

## 2019-07-12 ENCOUNTER — Other Ambulatory Visit: Payer: Self-pay | Admitting: Physician Assistant

## 2019-07-12 DIAGNOSIS — F419 Anxiety disorder, unspecified: Secondary | ICD-10-CM

## 2019-07-30 ENCOUNTER — Other Ambulatory Visit: Payer: Self-pay | Admitting: Physician Assistant

## 2019-07-30 DIAGNOSIS — F909 Attention-deficit hyperactivity disorder, unspecified type: Secondary | ICD-10-CM

## 2019-07-30 MED ORDER — AMPHETAMINE-DEXTROAMPHETAMINE 10 MG PO TABS: 10.0000 mg | ORAL_TABLET | Freq: Two times a day (BID) | ORAL | 0 refills | Status: DC

## 2019-08-29 ENCOUNTER — Telehealth (INDEPENDENT_AMBULATORY_CARE_PROVIDER_SITE_OTHER): Payer: 59 | Admitting: Physician Assistant

## 2019-08-29 ENCOUNTER — Encounter: Payer: Self-pay | Admitting: Physician Assistant

## 2019-08-29 DIAGNOSIS — F909 Attention-deficit hyperactivity disorder, unspecified type: Secondary | ICD-10-CM | POA: Diagnosis not present

## 2019-08-29 NOTE — Progress Notes (Signed)
Patient: Joyce Wallace Female    DOB: 09/20/1991   28 y.o.   MRN: [id number] Visit Date: 08/29/2019  Today's Provider: Trey Sailors, PA-C   Chief Complaint  Patient presents with  . ADHD   Subjective:    Virtual Visit via Video Note  I connected with Joyce Wallace on 08/29/19 at  2:00 PM EDT by a video enabled telemedicine application and verified that I am speaking with the correct person using two identifiers.  Location: Patient: Home Provider: Office   I discussed the limitations of evaluation and management by telemedicine and the availability of in person appointments. The patient expressed understanding and agreed to proceed.  Interactive audio and video communications were attempted, although failed due to patient's inability to connect to video. Continued visit with audio only interaction with patient agreement.   HPI  Follow up for ADHD  The patient was last seen for this 3 months ago. In the interime, she was switched from Evekeo 10 mg BID to Adderall 10 mg BID due to insurance issues. She reports she is doing OK with this and is functioning well at work. She feels this may work a little better   She reports excellent compliance with treatment. She feels that condition is stable. She is not having side effects.   In the interim she has moved into her new home.  ------------------------------------------------------------------------------------   Allergies  Allergen Reactions  . Amoxicillin     rash     Current Outpatient Medications:  .  Amphetamine Sulfate (EVEKEO) 10 MG TABS, Take 10 mg by mouth 2 (two) times daily., Disp: 60 tablet, Rfl: 0 .  amphetamine-dextroamphetamine (ADDERALL) 10 MG tablet, Take 1 tablet (10 mg total) by mouth 2 (two) times daily with a meal., Disp: 60 tablet, Rfl: 0 .  citalopram (CELEXA) 20 MG tablet, TAKE 1 TABLET BY MOUTH EVERY DAY, Disp: 90 tablet, Rfl: 1 .  Cyanocobalamin (VITAMIN B 12 PO),  Take by mouth., Disp: , Rfl:  .  etonogestrel-ethinyl estradiol (NUVARING) 0.12-0.015 MG/24HR vaginal ring, Insert vaginally and leave in place for 3 consecutive weeks, then remove for 1 week., Disp: 1 each, Rfl: 11 .  Multiple Vitamin (MULTIVITAMIN) tablet, Take 1 tablet by mouth daily., Disp: , Rfl:  .  Omega-3 Fatty Acids (FISH OIL PO), Take by mouth., Disp: , Rfl:   Review of Systems  Social History   Tobacco Use  . Smoking status: Never Smoker  . Smokeless tobacco: Never Used  Substance Use Topics  . Alcohol use: Yes    Comment: occassionally      Objective:   There were no vitals taken for this visit. There were no vitals filed for this visit.There is no height or weight on file to calculate BMI.   Physical Exam   Results for orders placed or performed in visit on 08/29/19  HM PAP SMEAR  Result Value Ref Range   HM Pap smear normal        Assessment & Plan    1. Attention deficit hyperactivity disorder (ADHD), unspecified ADHD type  We have changed Evekeo to Addderall due to insurance issues. 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 F/u by telephone visit in 4 months.   The entirety of the information documented in the History of Present Illness, Review of Systems and Physical Exam were personally obtained by me. Portions of this information were  initially documented by Lynford Humphrey, CMA and reviewed by me for thoroughness and accuracy.        Trinna Post, PA-C  [facility]

## 2019-09-15 ENCOUNTER — Other Ambulatory Visit: Payer: Self-pay | Admitting: Physician Assistant

## 2019-09-15 ENCOUNTER — Encounter: Payer: Self-pay | Admitting: Physician Assistant

## 2019-09-15 DIAGNOSIS — F909 Attention-deficit hyperactivity disorder, unspecified type: Secondary | ICD-10-CM

## 2019-09-15 NOTE — Telephone Encounter (Signed)
[name] was on 04/25/2019.

## 2019-09-16 ENCOUNTER — Other Ambulatory Visit: Payer: Self-pay | Admitting: Physician Assistant

## 2019-09-16 DIAGNOSIS — F909 Attention-deficit hyperactivity disorder, unspecified type: Secondary | ICD-10-CM

## 2019-09-17 MED ORDER — AMPHETAMINE-DEXTROAMPHETAMINE 15 MG PO TABS: 15.0000 mg | ORAL_TABLET | Freq: Two times a day (BID) | ORAL | 0 refills | Status: DC

## 2019-09-17 NOTE — Telephone Encounter (Signed)
Pt calling back to check in refill of Adderall.  Thanks, [employer]

## 2019-11-02 ENCOUNTER — Other Ambulatory Visit: Payer: Self-pay | Admitting: Physician Assistant

## 2019-11-02 DIAGNOSIS — F909 Attention-deficit hyperactivity disorder, unspecified type: Secondary | ICD-10-CM

## 2019-11-03 MED ORDER — AMPHETAMINE-DEXTROAMPHETAMINE 15 MG PO TABS: 15.0000 mg | ORAL_TABLET | Freq: Two times a day (BID) | ORAL | 0 refills | Status: DC

## 2019-11-20 ENCOUNTER — Encounter: Payer: Self-pay | Admitting: Physician Assistant

## 2019-11-20 ENCOUNTER — Telehealth: Payer: Self-pay

## 2019-11-20 DIAGNOSIS — F419 Anxiety disorder, unspecified: Secondary | ICD-10-CM

## 2019-11-20 MED ORDER — CITALOPRAM HYDROBROMIDE 20 MG PO TABS: 20.0000 mg | ORAL_TABLET | Freq: Every day | ORAL | 0 refills | Status: DC

## 2019-11-20 NOTE — Telephone Encounter (Signed)
RX sent

## 2019-11-27 ENCOUNTER — Encounter: Payer: Self-pay | Admitting: Physician Assistant

## 2019-12-08 ENCOUNTER — Other Ambulatory Visit: Payer: Self-pay | Admitting: Physician Assistant

## 2019-12-08 DIAGNOSIS — F909 Attention-deficit hyperactivity disorder, unspecified type: Secondary | ICD-10-CM

## 2019-12-08 NOTE — Telephone Encounter (Signed)
[name] was on 08/29/2019 and upcoming visit is 01/02/2020.

## 2019-12-09 MED ORDER — AMPHETAMINE-DEXTROAMPHETAMINE 15 MG PO TABS: 15.0000 mg | ORAL_TABLET | Freq: Two times a day (BID) | ORAL | 0 refills | Status: DC

## 2020-01-02 ENCOUNTER — Ambulatory Visit (INDEPENDENT_AMBULATORY_CARE_PROVIDER_SITE_OTHER): Payer: 59 | Admitting: Physician Assistant

## 2020-01-02 DIAGNOSIS — F909 Attention-deficit hyperactivity disorder, unspecified type: Secondary | ICD-10-CM | POA: Diagnosis not present

## 2020-01-02 MED ORDER — AMPHETAMINE-DEXTROAMPHETAMINE 15 MG PO TABS: 15.0000 mg | ORAL_TABLET | Freq: Two times a day (BID) | ORAL | 0 refills | Status: DC

## 2020-01-02 NOTE — Progress Notes (Signed)
Patient: Joyce Wallace Female    DOB: 06/19/1991   28 y.o.   MRN: [id number] Visit Date: 01/02/2020  Today's Provider: Trey Sailors, PA-C   Chief Complaint  Patient presents with  . ADHD   Subjective:    Virtual Visit via Telephone Note  I connected with Joyce Wallace on 01/02/20 at  2:00 PM EST by telephone and verified that I am speaking with the correct person using two identifiers.  Location: Patient: Home Provider: Office    I discussed the limitations, risks, security and privacy concerns of performing an evaluation and management service by telephone and the availability of in person appointments. I also discussed with the patient that there may be a patient responsible charge related to this service. The patient expressed understanding and agreed to proceed.  HPI    ADHD Patient presents today for ADHD follow-up. Patient was last seen virtually on 08/29/2019. Patient is currently taking Adderall 15 mg twice daily and reports good compliance with treatment. She is doing well on this dose. Denies insomnia, palpitations, weight loss. Not currently taking birth control.    Allergies  Allergen Reactions  . Amoxicillin     rash     Current Outpatient Medications:  .  amphetamine-dextroamphetamine (ADDERALL) 15 MG tablet, Take 1 tablet by mouth 2 (two) times daily., Disp: 60 tablet, Rfl: 0 .  citalopram (CELEXA) 20 MG tablet, Take 1 tablet (20 mg total) by mouth daily., Disp: 90 tablet, Rfl: 0 .  Cyanocobalamin (VITAMIN B 12 PO), Take by mouth., Disp: , Rfl:  .  Multiple Vitamin (MULTIVITAMIN) tablet, Take 1 tablet by mouth daily., Disp: , Rfl:  .  Omega-3 Fatty Acids (FISH OIL PO), Take by mouth., Disp: , Rfl:  .  Amphetamine Sulfate (EVEKEO) 10 MG TABS, Take 10 mg by mouth 2 (two) times daily. (Patient not taking: Reported on 01/02/2020), Disp: 60 tablet, Rfl: 0 .  amphetamine-dextroamphetamine (ADDERALL) 10 MG tablet, Take 1 tablet (10 mg  total) by mouth 2 (two) times daily with a meal., Disp: 60 tablet, Rfl: 0 .  etonogestrel-ethinyl estradiol (NUVARING) 0.12-0.015 MG/24HR vaginal ring, Insert vaginally and leave in place for 3 consecutive weeks, then remove for 1 week. (Patient not taking: Reported on 01/02/2020), Disp: 1 each, Rfl: 11  Review of Systems  Social History   Tobacco Use  . Smoking status: Never Smoker  . Smokeless tobacco: Never Used  Substance Use Topics  . Alcohol use: Yes    Comment: occassionally      Objective:   There were no vitals taken for this visit. There were no vitals filed for this visit.There is no height or weight on file to calculate BMI.   Physical Exam   No results found for any visits on 01/02/20.     Assessment & Plan    1. Attention deficit hyperactivity disorder (ADHD), unspecified ADHD type  Continue. Advised this medicine must be stopped in pregnancy. F/u 4 months.   - amphetamine-dextroamphetamine (ADDERALL) 15 MG tablet; Take 1 tablet by mouth 2 (two) times daily.  Dispense: 60 tablet; Refill: 0 I discussed the assessment and treatment plan with the patient. The patient was provided an opportunity to ask questions and all were answered. The patient agreed with the plan and demonstrated an understanding of the instructions.   The patient was advised to call back or seek an in-person evaluation if the symptoms worsen or if the condition fails to improve as anticipated.  I provided 15 minutes of non-face-to-face time during this encounter.  The entirety of the information documented in the History of Present Illness, Review of Systems and Physical Exam were personally obtained by me. Portions of this information were initially documented by [facility] and reviewed by me for thoroughness and accuracy.      Trinna Post, PA-C  [facility]

## 2020-01-14 ENCOUNTER — Other Ambulatory Visit: Payer: Self-pay | Admitting: Physician Assistant

## 2020-01-14 DIAGNOSIS — F909 Attention-deficit hyperactivity disorder, unspecified type: Secondary | ICD-10-CM

## 2020-01-15 MED ORDER — AMPHETAMINE-DEXTROAMPHETAMINE 15 MG PO TABS: 15.0000 mg | ORAL_TABLET | Freq: Two times a day (BID) | ORAL | 0 refills | Status: DC

## 2020-02-17 ENCOUNTER — Other Ambulatory Visit: Payer: Self-pay | Admitting: Physician Assistant

## 2020-02-17 DIAGNOSIS — F419 Anxiety disorder, unspecified: Secondary | ICD-10-CM

## 2020-02-17 NOTE — Telephone Encounter (Signed)
Requested Prescriptions  Pending Prescriptions Disp Refills  . citalopram (CELEXA) 20 MG tablet [Pharmacy Med Name: CITALOPRAM 20MG  TABLETS] 90 tablet 1    Sig: TAKE 1 TABLET(20 MG) BY MOUTH DAILY     Psychiatry:  Antidepressants - SSRI Passed - 02/17/2020  3:34 AM      Passed - Valid encounter within last 6 months    Recent Outpatient Visits          1 month ago Attention deficit hyperactivity disorder (ADHD), unspecified ADHD type   [facility] M, PA-C   5 months ago Attention deficit hyperactivity disorder (ADHD), unspecified ADHD type   [facility] M, M   9 months ago Attention deficit hyperactivity disorder (ADHD), unspecified ADHD type   [facility] M, M   9 months ago [place]   [place], [employer], [place] Cipro   1 year ago Attention deficit hyperactivity disorder (ADHD), other type   [facility] [place], [place], Lavella [place]

## 2020-02-25 ENCOUNTER — Other Ambulatory Visit: Payer: Self-pay | Admitting: Physician Assistant

## 2020-02-25 DIAGNOSIS — F909 Attention-deficit hyperactivity disorder, unspecified type: Secondary | ICD-10-CM

## 2020-02-25 MED ORDER — AMPHETAMINE-DEXTROAMPHETAMINE 15 MG PO TABS: 15.0000 mg | ORAL_TABLET | Freq: Two times a day (BID) | ORAL | 0 refills | Status: DC

## 2020-04-09 ENCOUNTER — Other Ambulatory Visit: Payer: Self-pay | Admitting: Physician Assistant

## 2020-04-09 DIAGNOSIS — F909 Attention-deficit hyperactivity disorder, unspecified type: Secondary | ICD-10-CM

## 2020-04-09 MED ORDER — AMPHETAMINE-DEXTROAMPHETAMINE 15 MG PO TABS: 15.0000 mg | ORAL_TABLET | Freq: Two times a day (BID) | ORAL | 0 refills | Status: DC

## 2020-04-30 ENCOUNTER — Telehealth (INDEPENDENT_AMBULATORY_CARE_PROVIDER_SITE_OTHER): Payer: 59 | Admitting: Physician Assistant

## 2020-04-30 DIAGNOSIS — F909 Attention-deficit hyperactivity disorder, unspecified type: Secondary | ICD-10-CM

## 2020-04-30 MED ORDER — AMPHETAMINE-DEXTROAMPHETAMINE 15 MG PO TABS: 15.0000 mg | ORAL_TABLET | Freq: Two times a day (BID) | ORAL | 0 refills | Status: DC

## 2020-04-30 NOTE — Progress Notes (Signed)
MyChart Video Visit    Virtual Visit via Video Note   This visit type was conducted due to national recommendations for restrictions regarding the COVID-19 Pandemic (e.g. social distancing) in an effort to limit this patient's exposure and mitigate transmission in our community. This patient is at least at moderate risk for complications without adequate follow up. This format is felt to be most appropriate for this patient at this time. Physical exam was limited by quality of the video and audio technology used for the visit.   Patient location: Home Provider location: Office    Patient: Joyce Wallace   DOB: 1991-02-10   29 y.o. Female  MRN: [id number] Visit Date: 04/30/2020  Today's healthcare provider: Trey Sailors, PA-C   Chief Complaint  Patient presents with  . ADHD  I,Adriana M Pollak,acting as a scribe for Trey Sailors, PA-C.,have documented all relevant documentation on the behalf of Trey Sailors, PA-C,as directed by  Trey Sailors, PA-C while in the presence of Trey Sailors, PA-C.  Subjective    HPI Follow up for ADHD  The patient was last seen for this 4 months ago. Changes made at last visit include continue current medication.  She reports good compliance with treatment. She feels that condition is Improved. She is not having side effects.   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  -----------------------------------------------------------------------------------------     Medications: Outpatient Medications Prior to Visit  Medication Sig  . citalopram (CELEXA) 20 MG tablet TAKE 1 TABLET(20 MG) BY MOUTH DAILY  . Cyanocobalamin (VITAMIN B 12 PO) Take by mouth.  . Multiple Vitamin (MULTIVITAMIN) tablet Take 1 tablet by mouth daily.  . Omega-3 Fatty Acids (FISH OIL PO) Take by mouth.  . [DISCONTINUED] amphetamine-dextroamphetamine (ADDERALL) 15 MG tablet Take 1 tablet by mouth 2 (two) times daily.    No facility-administered medications prior to visit.    Review of Systems  Constitutional: Negative.   Respiratory: Negative.   Neurological: Negative.   Psychiatric/Behavioral: Negative.       Objective    There were no vitals taken for this visit.   Physical Exam Constitutional:      Appearance: Normal appearance.  Pulmonary:     Effort: Pulmonary effort is normal. No respiratory distress.  Neurological:     Mental Status: She is alert.  Psychiatric:        Mood and Affect: Mood normal.        Behavior: Behavior normal.        Assessment & Plan    1. Attention deficit hyperactivity disorder (ADHD), unspecified ADHD type  Continue, counseled on pregnancy precautions.   - amphetamine-dextroamphetamine (ADDERALL) 15 MG tablet; Take 1 tablet by mouth 2 (two) times daily.  Dispense: 60 tablet; Refill: 0   Return in about 3 months (around 07/31/2020) for ADHD .     I discussed the assessment and treatment plan with the patient. The patient was provided an opportunity to ask questions and all were answered. The patient agreed with the plan and demonstrated an understanding of the instructions.   The patient was advised to call back or seek an in-person evaluation if the symptoms worsen or if the condition fails to improve as anticipated.   ITrey Sailors, PA-C, have reviewed all documentation for this visit. The documentation on 05/03/20 for the exam, diagnosis, procedures, and orders are all accurate and complete.   Trey Sailors, PA-C [facility]  [phone] (phone) [phone] (fax)  [place]

## 2020-05-18 ENCOUNTER — Other Ambulatory Visit: Payer: Self-pay | Admitting: Physician Assistant

## 2020-05-18 DIAGNOSIS — F909 Attention-deficit hyperactivity disorder, unspecified type: Secondary | ICD-10-CM

## 2020-05-20 MED ORDER — AMPHETAMINE-DEXTROAMPHETAMINE 15 MG PO TABS: 15.0000 mg | ORAL_TABLET | Freq: Two times a day (BID) | ORAL | 0 refills | Status: DC

## 2020-06-25 ENCOUNTER — Other Ambulatory Visit: Payer: Self-pay | Admitting: Physician Assistant

## 2020-06-25 DIAGNOSIS — F909 Attention-deficit hyperactivity disorder, unspecified type: Secondary | ICD-10-CM

## 2020-06-25 NOTE — Telephone Encounter (Signed)
PT need a refill amphetamine-dextroamphetamine (ADDERALL) 15 MG tablet [[id number]] [id number] - [place], [place] - [address] AT [facility] [address]  [address] [place] [address]  Phone: [phone] Fax: [phone]

## 2020-06-25 NOTE — Telephone Encounter (Signed)
Please advise 

## 2020-06-25 NOTE — Telephone Encounter (Signed)
Requested medication (s) are due for refill today: yes  Requested medication (s) are on the active medication list: yes  Last refill:  05/20/2020  Future visit scheduled: no  Notes to clinic:  this refill cannot be delegated    Requested Prescriptions  Pending Prescriptions Disp Refills   amphetamine-dextroamphetamine (ADDERALL) 15 MG tablet 60 tablet 0    Sig: Take 1 tablet by mouth 2 (two) times daily.      Not Delegated - Psychiatry:  Stimulants/ADHD Failed - 06/25/2020 10:57 AM      Failed - This refill cannot be delegated      Failed - Urine Drug Screen completed in last 360 days.      Passed - Valid encounter within last 3 months    Recent Outpatient Visits           1 month ago Attention deficit hyperactivity disorder (ADHD), unspecified ADHD type   [facility] Os[facility]do Angst M, [place]   5 months ago Attention deficit hyperactivity disorder (ADHD), unspecified ADHD type   [facility]do Angst M, [place]   10 months ago Attention deficit hyperactivity disorder (ADHD), unspecified ADHD type   [facility] Trey Sailors, [place]   1 year ago Attention deficit hyperactivity disorder (ADHD), unspecified ADHD type   [facility] Trey Sailors, [place]   1 year ago [place]   [facility] [place], [place], [place]

## 2020-06-28 ENCOUNTER — Other Ambulatory Visit: Payer: Self-pay | Admitting: Physician Assistant

## 2020-06-28 DIAGNOSIS — F909 Attention-deficit hyperactivity disorder, unspecified type: Secondary | ICD-10-CM

## 2020-06-30 MED ORDER — AMPHETAMINE-DEXTROAMPHETAMINE 15 MG PO TABS: 15.0000 mg | ORAL_TABLET | Freq: Two times a day (BID) | ORAL | 0 refills | Status: DC

## 2020-07-26 ENCOUNTER — Other Ambulatory Visit: Payer: Self-pay | Admitting: Physician Assistant

## 2020-07-26 DIAGNOSIS — F909 Attention-deficit hyperactivity disorder, unspecified type: Secondary | ICD-10-CM

## 2020-07-29 ENCOUNTER — Other Ambulatory Visit: Payer: Self-pay | Admitting: Physician Assistant

## 2020-07-29 DIAGNOSIS — F909 Attention-deficit hyperactivity disorder, unspecified type: Secondary | ICD-10-CM

## 2020-08-03 MED ORDER — AMPHETAMINE-DEXTROAMPHETAMINE 15 MG PO TABS: 15.0000 mg | ORAL_TABLET | Freq: Two times a day (BID) | ORAL | 0 refills | Status: DC

## 2020-08-13 ENCOUNTER — Telehealth (INDEPENDENT_AMBULATORY_CARE_PROVIDER_SITE_OTHER): Payer: 59 | Admitting: Physician Assistant

## 2020-08-13 DIAGNOSIS — F909 Attention-deficit hyperactivity disorder, unspecified type: Secondary | ICD-10-CM | POA: Diagnosis not present

## 2020-08-13 DIAGNOSIS — F419 Anxiety disorder, unspecified: Secondary | ICD-10-CM | POA: Diagnosis not present

## 2020-08-13 MED ORDER — AMPHETAMINE-DEXTROAMPHETAMINE 15 MG PO TABS: 15.0000 mg | ORAL_TABLET | Freq: Every day | ORAL | 0 refills | Status: DC

## 2020-08-13 MED ORDER — CITALOPRAM HYDROBROMIDE 20 MG PO TABS: ORAL_TABLET | ORAL | 1 refills | Status: DC

## 2020-08-13 MED ORDER — AMPHETAMINE-DEXTROAMPHETAMINE 15 MG PO TABS: 15.0000 mg | ORAL_TABLET | Freq: Two times a day (BID) | ORAL | 0 refills | Status: DC

## 2020-08-13 NOTE — Progress Notes (Signed)
MyChart Video Visit    Virtual Visit via Video Note   This visit type was conducted due to national recommendations for restrictions regarding the COVID-19 Pandemic (e.g. social distancing) in an effort to limit this patient's exposure and mitigate transmission in our community. This patient is at least at moderate risk for complications without adequate follow up. This format is felt to be most appropriate for this patient at this time. Physical exam was limited by quality of the video and audio technology used for the visit.   Patient location: Home Provider location: Office   I discussed the limitations of evaluation and management by telemedicine and the availability of in person appointments. The patient expressed understanding and agreed to proceed.  Patient: Joyce Wallace   DOB: 1991-01-27   28 y.o. Female  MRN: [id number] Visit Date: 08/13/2020  Today's healthcare provider: Trey Sailors, PA-C   Chief Complaint  Patient presents with  . Follow-up  . ADHD   Subjective    HPI  Follow up for ADHD  The patient was last seen for this 3 months ago. Changes made at last visit include no changes.  She reports good compliance with treatment. She feels that condition is Unchanged. She is not having side effects.   She is getting a divorce. She is selling her house. 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 She is moving in one week.    Medications: Outpatient Medications Prior to Visit  Medication Sig  . Cyanocobalamin (VITAMIN B 12 PO) Take by mouth.  . Multiple Vitamin (MULTIVITAMIN) tablet Take 1 tablet by mouth daily.  . Omega-3 Fatty Acids (FISH OIL PO) Take by mouth.  . [DISCONTINUED] amphetamine-dextroamphetamine (ADDERALL) 15 MG tablet Take 1 tablet by mouth 2 (two) times daily.  . [DISCONTINUED] citalopram (CELEXA) 20 MG tablet TAKE 1 TABLET(20 MG) BY MOUTH DAILY   No facility-administered medications prior to visit.    Review of  Systems  Constitutional: Negative.   Respiratory: Negative.   Cardiovascular: Negative.   Musculoskeletal: Negative.   Neurological: Negative.   Psychiatric/Behavioral: Negative.       Objective    There were no vitals taken for this visit.   Physical Exam Constitutional:      Appearance: Normal appearance.  Pulmonary:     Effort: Pulmonary effort is normal. No respiratory distress.  Neurological:     Mental Status: She is alert.  Psychiatric:        Mood and Affect: Mood normal.        Behavior: Behavior normal.        Assessment & Plan    1. Attention deficit hyperactivity disorder (ADHD), unspecified ADHD type  Will refill, she is moving to the [place] area and will establish there. She can try to transfer RX first, if not successful, will order at local pharmacy.   - amphetamine-dextroamphetamine (ADDERALL) 15 MG tablet; Take 1 tablet by mouth 2 (two) times daily.  Dispense: 60 tablet; Refill: 0 - amphetamine-dextroamphetamine (ADDERALL) 15 MG tablet; Take 1 tablet by mouth daily.  Dispense: 60 tablet; Refill: 0 - amphetamine-dextroamphetamine (ADDERALL) 15 MG tablet; Take 1 tablet by mouth daily.  Dispense: 60 tablet; Refill: 0  2. Anxiety  Stable, pulled for refill.   - citalopram (CELEXA) 20 MG tablet; TAKE 1 TABLET(20 MG) BY MOUTH DAILY  Dispense: 90 tablet; Refill: 1    No follow-ups on file.     I discussed the assessment and treatment plan with the patient. The  patient was provided an opportunity to ask questions and all were answered. The patient agreed with the plan and demonstrated an understanding of the instructions.   The patient was advised to call back or seek an in-person evaluation if the symptoms worsen or if the condition fails to improve as anticipated.   ITrey Sailors, PA-C, have reviewed all documentation for this visit. The documentation on 08/13/20 for the exam, diagnosis, procedures, and orders are all accurate and  complete.  The entirety of the information documented in the History of Present Illness, Review of Systems and Physical Exam were personally obtained by me. Portions of this information were initially documented by Anson [place], CMA and reviewed by me for thoroughness and accuracy.    Maryella Shivers [facility] [phone] (phone) [phone] (fax)  [facility]

## 2020-09-01 ENCOUNTER — Other Ambulatory Visit: Payer: Self-pay | Admitting: Physician Assistant

## 2020-09-01 DIAGNOSIS — F909 Attention-deficit hyperactivity disorder, unspecified type: Secondary | ICD-10-CM

## 2020-09-01 MED ORDER — AMPHETAMINE-DEXTROAMPHETAMINE 15 MG PO TABS: 15.0000 mg | ORAL_TABLET | Freq: Two times a day (BID) | ORAL | 0 refills | Status: DC

## 2020-09-06 ENCOUNTER — Encounter: Payer: Self-pay | Admitting: Physician Assistant

## 2020-09-07 ENCOUNTER — Telehealth: Payer: Self-pay

## 2020-09-07 DIAGNOSIS — F419 Anxiety disorder, unspecified: Secondary | ICD-10-CM

## 2020-09-07 DIAGNOSIS — F909 Attention-deficit hyperactivity disorder, unspecified type: Secondary | ICD-10-CM

## 2020-09-07 NOTE — Telephone Encounter (Signed)
Copied from CRM [id number]. Topic: General - Other >> Sep 07, 2020  1:18 PM Dalphine Handing A wrote: Patient was advised by Jodi Marble to contact office if she ran into any issues having prescriptions transferred to [facility] pharmacy. Patient needs offtice to contact pharmacy and send prescriptions to them for the adderall and celexa. [facility] on [address] [place], [place] [address]

## 2020-09-07 NOTE — Telephone Encounter (Signed)
Pt has call called about meds  amphetamine-dextroamphetamine (ADDERALL) 15 MG tablet  As did as discussed with [facility] sending to another location as mentioned at last appt but as a controlled substance they will not do. Pt wants a FU call if this cannot be done another way as she is out of meds and has been cutting in half. Her # for FU is [phone]

## 2020-09-08 MED ORDER — AMPHETAMINE-DEXTROAMPHETAMINE 15 MG PO TABS: 15.0000 mg | ORAL_TABLET | Freq: Two times a day (BID) | ORAL | 0 refills | Status: DC

## 2020-09-08 MED ORDER — CITALOPRAM HYDROBROMIDE 20 MG PO TABS: ORAL_TABLET | ORAL | 1 refills | Status: AC

## 2020-09-08 NOTE — Addendum Note (Signed)
Addended by: Trey Sailors on: 09/08/2020 12:57 PM   Modules accepted: Orders

## 2020-09-08 NOTE — Telephone Encounter (Signed)
Please review. Thanks!  

## 2020-09-13 ENCOUNTER — Telehealth: Payer: Self-pay

## 2020-09-13 DIAGNOSIS — F909 Attention-deficit hyperactivity disorder, unspecified type: Secondary | ICD-10-CM

## 2020-09-13 MED ORDER — AMPHETAMINE-DEXTROAMPHETAMINE 15 MG PO TABS: 15.0000 mg | ORAL_TABLET | Freq: Two times a day (BID) | ORAL | 0 refills | Status: AC

## 2020-09-13 NOTE — Telephone Encounter (Signed)
Copied from CRM [id number]. Topic: General - Other >> Sep 13, 2020 10:32 AM Dalphine Handing A wrote: Pharmacy called and stated that the adderall medication request they received needs to be sent back over with an MD or DO signature. Please advise

## 2022-11-03 NOTE — Progress Notes (Deleted)
      Established patient visit   Patient: Joyce Wallace   DOB: Sep 19, 1991   31 y.o. Female  MRN: [id number] Visit Date: 11/10/2022  Today's healthcare provider: Debera Lat, PA-C   No chief complaint on file.  Subjective    HPI  ***  Medications: Outpatient Medications Prior to Visit  Medication Sig   amphetamine-dextroamphetamine (ADDERALL) 15 MG tablet Take 1 tablet by mouth 2 (two) times daily.   amphetamine-dextroamphetamine (ADDERALL) 15 MG tablet Take 1 tablet by mouth 2 (two) times daily.   citalopram (CELEXA) 20 MG tablet TAKE 1 TABLET(20 MG) BY MOUTH DAILY   Cyanocobalamin (VITAMIN B 12 PO) Take by mouth.   Multiple Vitamin (MULTIVITAMIN) tablet Take 1 tablet by mouth daily.   Omega-3 Fatty Acids (FISH OIL PO) Take by mouth.   No facility-administered medications prior to visit.    Review of Systems  {Labs  Heme  Chem  Endocrine  Serology  Results Review (optional):23779}   Objective    There were no vitals taken for this visit. {Show previous vital signs (optional):23777}  Physical Exam  ***  No results found for any visits on 11/10/22.  Assessment & Plan     ***  No follow-ups on file.      {provider attestation***:1}   Debera Lat, Cordelia Poche  [facility] [phone] (phone) [phone] (fax)  [facility]

## 2022-11-10 ENCOUNTER — Ambulatory Visit: Payer: 59 | Admitting: Physician Assistant

## 2022-11-10 ENCOUNTER — Ambulatory Visit: Payer: BLUE CROSS/BLUE SHIELD | Admitting: Family Medicine

## 2022-11-10 NOTE — Progress Notes (Deleted)
    I,Marquest Gunkel E Derwood Becraft,acting as a scribe for Gwyneth Sprout, FNP.,have documented all relevant documentation on the behalf of Gwyneth Sprout, FNP,as directed by  Gwyneth Sprout, FNP while in the presence of Gwyneth Sprout, FNP.    Established patient visit   Patient: Joyce Wallace   DOB: October 18, 1991   32 y.o. Female  MRN: [id number] Visit Date: 11/10/2022  Today's healthcare provider: Gwyneth Sprout, FNP   No chief complaint on file.  Subjective    HPI  Follow up for ADHD  The patient was last seen for this more than a year ago. Changes made at last visit include none.  She reports {excellent/good/fair/poor:19665} compliance with treatment. She feels that condition is {improved/worse/unchanged:3041574}. She {is/is not:21021397} having side effects. ***  -----------------------------------------------------------------------------------------   Medications: Outpatient Medications Prior to Visit  Medication Sig   amphetamine-dextroamphetamine (ADDERALL) 15 MG tablet Take 1 tablet by mouth 2 (two) times daily.   amphetamine-dextroamphetamine (ADDERALL) 15 MG tablet Take 1 tablet by mouth 2 (two) times daily.   citalopram (CELEXA) 20 MG tablet TAKE 1 TABLET(20 MG) BY MOUTH DAILY   Cyanocobalamin (VITAMIN B 12 PO) Take by mouth.   Multiple Vitamin (MULTIVITAMIN) tablet Take 1 tablet by mouth daily.   Omega-3 Fatty Acids (FISH OIL PO) Take by mouth.   No facility-administered medications prior to visit.    Review of Systems  {Labs  Heme  Chem  Endocrine  Serology  Results Review (optional):23779}   Objective    There were no vitals taken for this visit. {Show previous vital signs (optional):23777}  Physical Exam  ***  No results found for any visits on 11/10/22.  Assessment & Plan     ***  No follow-ups on file.      {provider attestation***:1}   Gwyneth Sprout, [place] [phone] (phone) [phone] (fax)  [place]
